# Patient Record
Sex: Male | Born: 1995 | Race: White | Hispanic: No | Marital: Single | Smoking: Never smoker
Health system: Southern US, Community
[De-identification: ages and names within clinical notes are randomized; demographics above are authoritative.]

## PROBLEM LIST (undated history)

## (undated) DIAGNOSIS — K859 Acute pancreatitis without necrosis or infection, unspecified: Secondary | ICD-10-CM

## (undated) HISTORY — DX: Acute pancreatitis without necrosis or infection, unspecified: K85.90

---
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
## 2015-01-21 ENCOUNTER — Encounter: Payer: Self-pay | Admitting: Allergy and Immunology

## 2015-01-21 ENCOUNTER — Ambulatory Visit (INDEPENDENT_AMBULATORY_CARE_PROVIDER_SITE_OTHER): Payer: 59 | Admitting: Allergy and Immunology

## 2015-01-21 VITALS — BP 124/80 | HR 76 | Temp 97.7°F | Resp 12 | Ht 72.64 in | Wt 239.4 lb

## 2015-01-21 DIAGNOSIS — J3089 Other allergic rhinitis: Secondary | ICD-10-CM | POA: Diagnosis not present

## 2015-01-21 DIAGNOSIS — J4530 Mild persistent asthma, uncomplicated: Secondary | ICD-10-CM | POA: Diagnosis not present

## 2015-01-21 MED ORDER — BECLOMETHASONE DIPROPIONATE 40 MCG/ACT IN AERS: 2.0000 | INHALATION_SPRAY | Freq: Two times a day (BID) | RESPIRATORY_TRACT | Status: DC

## 2015-01-21 MED ORDER — MONTELUKAST SODIUM 10 MG PO TABS: 10.0000 mg | ORAL_TABLET | Freq: Every day | ORAL | Status: DC

## 2015-01-21 MED ORDER — ALBUTEROL SULFATE HFA 108 (90 BASE) MCG/ACT IN AERS: 2.0000 | INHALATION_SPRAY | RESPIRATORY_TRACT | Status: DC | PRN

## 2015-01-21 MED ORDER — FLUTICASONE PROPIONATE 50 MCG/ACT NA SUSP: 2.0000 | NASAL | Status: DC | PRN

## 2015-01-21 MED ORDER — AEROCHAMBER PLUS MISC: Status: DC

## 2015-01-21 NOTE — Patient Instructions (Addendum)
Mild persistent asthma  A prescription has been provided for Qvar (beclomethasone) 40 g, 2 inhalations twice a day. To maximize pulmonary deposition, a spacer has been provided along with instructions for its proper administration with an HFA inhaler.  A prescription has been provided for montelukast 10 mg daily at bedtime.  Continue albuterol HFA, 1-2 inhalations via spacer device every 4-6 hours as needed.  Subjective and objective measures of pulmonary function will be followed and the treatment plan will be adjusted accordingly.     Allergic rhinitis Perennial and seasonal allergic rhinoconjunctivitis.  Aeroallergen avoidance measures have been discussed and provided in written form.  A prescription has been provided for fluticasone nasal spray, 2 sprays per nostril daily as needed. Proper nasal spray technique has been discussed and demonstrated.  A prescription has been provided for montelukast (as above).  Cetirizine 10 mg daily as needed.  The risks and benefits of aeroallergen immunotherapy have been discussed. The patient is motivated to initiate immunotherapy if insurance coverage is favorable. He will let us know how he would like to proceed.    Return in about 6 weeks (around 03/04/2015), or if symptoms worsen or fail to improve.  Reducing Pollen Exposure  The American Academy of Allergy, Asthma and Immunology suggests the following steps to reduce your exposure to pollen during allergy seasons.    1. Do not hang sheets or clothing out to dry; pollen may collect on these items. 2. Do not mow lawns or spend time around freshly cut grass; mowing stirs up pollen. 3. Keep windows closed at night.  Keep car windows closed while driving. 4. Minimize morning activities outdoors, a time when pollen counts are usually at their highest. 5. Stay indoors as much as possible when pollen counts or humidity is high and on windy days when pollen tends to remain in the air  longer. 6. Use air conditioning when possible.  Many air conditioners have filters that trap the pollen spores. 7. Use a HEPA room air filter to remove pollen form the indoor air you breathe.  Control of Mold Allergen  Mold and fungi can grow on a variety of surfaces provided certain temperature and moisture conditions exist.  Outdoor molds grow on plants, decaying vegetation and soil.  The major outdoor mold, Alternaria dn Cladosporium, are found in very high numbers during hot and dry conditions.  Generally, a late Summer - Fall peak is seen for common outdoor fungal spores.  Rain will temporarily lower outdoor mold spore count, but counts rise rapidly when the rainy period ends.  The most important indoor molds are Aspergillus and Penicillium.  Dark, humid and poorly ventilated basements are ideal sites for mold growth.  The next most common sites of mold growth are the bathroom and the kitchen.  Outdoor [employer] 2. Use air conditioning and keep windows closed 3. Avoid exposure to decaying vegetation. 4. Avoid leaf raking. 5. Avoid grain handling. 6. Consider wearing a face mask if working in moldy areas.  Indoor Mold Control 1. Maintain humidity below 50%. 2. Clean washable surfaces with 5% bleach solution. 3. Remove sources e.g. Contaminated carpets.  Control of Dog or Cat Allergen  Avoidance is the best way to manage a dog or cat allergy. If you have a dog or cat and are allergic to dog or cats, consider removing the dog or cat from the home. If you have a dog or cat but don't want to find it a new home, or if your family wants  a pet even though someone in the household is allergic, here are some strategies that may help keep symptoms at bay:  1. Keep the pet out of your bedroom and restrict it to only a few rooms. Be advised that keeping the dog or cat in only one room will not limit the allergens to that room. 2. Don't pet, hug or kiss the dog or cat; if you do, wash your hands  with soap and water. 3. High-efficiency particulate air (HEPA) cleaners run continuously in a bedroom or living room can reduce allergen levels over time. 4. Regular use of a high-efficiency vacuum cleaner or a central vacuum can reduce allergen levels. 5. Giving your dog or cat a bath at least once a week can reduce airborne allergen.  Control of House Dust Mite Allergen  House dust mites play a major role in allergic asthma and rhinitis.  They occur in environments with high humidity wherever human skin, the food for dust mites is found. High levels have been detected in dust obtained from mattresses, pillows, carpets, upholstered furniture, bed covers, clothes and soft toys.  The principal allergen of the house dust mite is found in its feces.  A gram of dust may contain 1,000 mites and 250,000 fecal particles.  Mite antigen is easily measured in the air during house cleaning activities.    1. Encase mattresses, including the box spring, and pillow, in an air tight cover.  Seal the zipper end of the encased mattresses with wide adhesive tape. 2. Wash the bedding in water of 130 degrees Farenheit weekly.  Avoid cotton comforters/quilts and flannel bedding: the most ideal bed covering is the dacron comforter. 3. Remove all upholstered furniture from the bedroom. 4. Remove carpets, carpet padding, rugs, and non-washable window drapes from the bedroom.  Wash drapes weekly or use plastic window coverings. 5. Remove all non-washable stuffed toys from the bedroom.  Wash stuffed toys weekly. 6. Have the room cleaned frequently with a vacuum cleaner and a damp dust-mop.  The patient should not be in a room which is being cleaned and should wait 1 hour after cleaning before going into the room. 7. Close and seal all heating outlets in the bedroom.  Otherwise, the room will become filled with dust-laden air.  An electric heater can be used to heat the room. 8. Reduce indoor humidity to less than 50%.  Do  not use a humidifier.  Control of Cockroach Allergen  Cockroach allergen has been identified as an important cause of acute attacks of asthma, especially in urban settings.  There are fifty-five species of cockroach that exist in the [place], however only three, the [place], [place] and Oriental species produce allergen that can affect patients with Asthma.  Allergens can be obtained from fecal particles, egg casings and secretions from cockroaches.    1. Remove food sources. 2. Reduce access to water. 3. Seal access and entry points. 4. Spray runways with 0.5-1% Diazinon or Chlorpyrifos 5. Blow boric acid power under stoves and refrigerator. 6. Place bait stations (hydramethylnon) at feeding sites.

## 2015-01-21 NOTE — Assessment & Plan Note (Signed)
   A prescription has been provided for Qvar (beclomethasone) 40 g, 2 inhalations twice a day. To maximize pulmonary deposition, a spacer has been provided along with instructions for its proper administration with an HFA inhaler.  A prescription has been provided for montelukast 10 mg daily at bedtime.  Continue albuterol HFA, 1-2 inhalations via spacer device every 4-6 hours as needed.  Subjective and objective measures of pulmonary function will be followed and the treatment plan will be adjusted accordingly.

## 2015-01-21 NOTE — Assessment & Plan Note (Addendum)
Perennial and seasonal allergic rhinoconjunctivitis.  Aeroallergen avoidance measures have been discussed and provided in written form.  A prescription has been provided for fluticasone nasal spray, 2 sprays per nostril daily as needed. Proper nasal spray technique has been discussed and demonstrated.  A prescription has been provided for montelukast (as above).  Cetirizine 10 mg daily as needed.  The risks and benefits of aeroallergen immunotherapy have been discussed. The patient is motivated to initiate immunotherapy if insurance coverage is favorable. He will let us know how he would like to proceed.

## 2015-01-21 NOTE — Progress Notes (Signed)
History of present illness: HPI Comments: Gabriel Gonzalez is a 19 y.o. male who presents today for his initial consultation of possible asthma.  He is accompanied by his mother who assists with a history.  Approximately one year ago, Gabriel Gonzalez began to experience nocturnal awakenings due to coughing.  He was prescribed albuterol HFA which provided temporary relief to his symptoms.  The frequency and severity of his symptoms have progressed over the past year, including coughing, chest tightness, dyspnea, and wheezing.  Specific triggers include laughing and upper respiratory tract infections, however he does experience these symptoms "at random" throughout the day.  He requires albuterol rescue 2 or 3 times per week on average. Gabriel Gonzalez experiences nasal congestion, rhinorrhea, sneezing, postnasal drainage, ocular pruritus, and occasional sinus pressure.  These symptoms occur year round but seem to be more severe in the springtime and in the fall.   Assessment and plan: Mild persistent asthma  A prescription has been provided for Qvar (beclomethasone) 40 g, 2 inhalations twice a day. To maximize pulmonary deposition, a spacer has been provided along with instructions for its proper administration with an HFA inhaler.  A prescription has been provided for montelukast 10 mg daily at bedtime.  Continue albuterol HFA, 1-2 inhalations via spacer device every 4-6 hours as needed.  Subjective and objective measures of pulmonary function will be followed and the treatment plan will be adjusted accordingly.     Allergic rhinitis Perennial and seasonal allergic rhinoconjunctivitis.  Aeroallergen avoidance measures have been discussed and provided in written form.  A prescription has been provided for fluticasone nasal spray, 2 sprays per nostril daily as needed. Proper nasal spray technique has been discussed and demonstrated.  A prescription has been provided for montelukast (as  above).  Cetirizine 10 mg daily as needed.  The risks and benefits of aeroallergen immunotherapy have been discussed. The patient is motivated to initiate immunotherapy if insurance coverage is favorable. He will let us know how he would like to proceed.    Medications ordered this encounter: No orders of the defined types were placed in this encounter.    Diagnositics: Spirometry: FVC was 5.89 L (107% predicted) and FEV1 was 4.01 L (84% predicted) with significant (450 mL) post bronchodilator improvement. Allergy skin testing: Positive to grass pollen, weed pollen, ragweed pollen, mold, dust mite, cat hair, dog epithelia, and cockroach antigen.    Physical examination: Blood pressure 124/80, pulse 76, temperature 97.7 F (36.5 C), temperature source Oral, resp. rate 12, height 6' 0.64" (1.845 m), weight 239 lb 6.7 oz (108.6 kg).  General: Alert, interactive, in no acute distress. HEENT: TMs pearly gray, turbinates moderately edematous and pale without discharge, post-pharynx erythematous. Neck: Supple without lymphadenopathy. Lungs: Clear to auscultation without wheezing, rhonchi or rales. CV: Normal S1, S2 without murmurs. Abdomen: Nondistended, nontender. Skin: Warm and dry, without lesions or rashes. Extremities:  No clubbing, cyanosis or edema. Neuro:   Grossly intact.  Review of systems: Review of Systems  Constitutional: Negative for fever, chills and weight loss.  HENT: Negative for nosebleeds.   Eyes: Negative for blurred vision.  Respiratory: Negative for hemoptysis.   Cardiovascular: Negative for chest pain.  Gastrointestinal: Negative for diarrhea and constipation.  Genitourinary: Negative for dysuria.  Musculoskeletal: Negative for myalgias and joint pain.  Neurological: Negative for dizziness.  Endo/Heme/Allergies: Does not bruise/bleed easily.    Past medical history: History reviewed. No pertinent past medical history.  Past surgical history: History  reviewed. No pertinent past surgical history.  Family  history: Family History  Problem Relation Age of Onset  . Cancer Maternal Grandmother     liver  . Psoriasis Mother   . Eczema Maternal Aunt   . Asthma Paternal Uncle   . Urticaria Neg Hx   . Immunodeficiency Neg Hx   . Atopy Neg Hx   . Angioedema Neg Hx   . Allergic rhinitis Neg Hx     Social history: Social History   Social History  . Marital Status: Single    Spouse Name: N/A  . Number of Children: N/A  . Years of Education: N/A   Occupational History  . Not on file.   Social History Main Topics  . Smoking status: Never Smoker   . Smokeless tobacco: Never Used  . Alcohol Use: Not on file  . Drug Use: Not on file  . Sexual Activity: Not on file   Other Topics Concern  . Not on file   Social History Narrative   Environmental History: The patient lives in a 20 year old house with carpeting in the bedroom.  There are 2 rabbits in the house and one dog outdoors.  No pets have access to his bedroom.  He is a nonsmoker and is not exposed to significant secondhand cigarette smoke.  Known medication allergies: No Known Allergies  Outpatient medications:   Medication List       This list is accurate as of: 01/21/15 11:02 AM.  Always use your most recent med list.               albuterol 108 (90 BASE) MCG/ACT inhaler  Commonly known as:  PROVENTIL HFA;VENTOLIN HFA  Inhale 2 puffs into the lungs every 6 (six) hours as needed for wheezing.        I appreciate the opportunity to take part in this Jordan's care. Please do not hesitate to contact me with questions.  Sincerely,   R. Jorene Guestarter Karandeep Resende, MD

## 2015-01-23 ENCOUNTER — Other Ambulatory Visit: Payer: Self-pay | Admitting: *Deleted

## 2015-01-23 DIAGNOSIS — J4530 Mild persistent asthma, uncomplicated: Secondary | ICD-10-CM

## 2015-03-17 ENCOUNTER — Encounter: Payer: Self-pay | Admitting: Allergy and Immunology

## 2015-03-17 ENCOUNTER — Ambulatory Visit (INDEPENDENT_AMBULATORY_CARE_PROVIDER_SITE_OTHER): Payer: 59 | Admitting: Allergy and Immunology

## 2015-03-17 VITALS — BP 112/78 | HR 80 | Resp 18

## 2015-03-17 DIAGNOSIS — J3089 Other allergic rhinitis: Secondary | ICD-10-CM | POA: Diagnosis not present

## 2015-03-17 DIAGNOSIS — J4530 Mild persistent asthma, uncomplicated: Secondary | ICD-10-CM

## 2015-03-17 NOTE — Progress Notes (Signed)
RE: Gabriel Gonzalez MRN: [id number] DOB: [birth year] [facility] [place] [address] [place]  [place] [id number] Date of Office Visit: 03/17/2015  Referring provider: Merlyn AlbertWilliam S Luking, MD [address] [place], [place] [id number]  History of present illness: HPI Comments: Gabriel Brownsnthony "[place]" Loney Gonzalez is a 19 y.o. male with persistent asthma and allergic rhinitis who presents today for follow up.  He reports that his asthma and allergic rhinitis symptoms have improved significantly in the interval since his initial visit 2 months ago.  He has not required asthma rescue medication, experienced nocturnal awakenings due to lower respiratory symptoms, nor have activities of daily living been limited.  He has no nasal symptom complaints today.   Assessment and plan: Mild persistent asthma Well-controlled.  We will stepdown therapy at this time.  Decrease Qvar 40 g to one inhalation via spacer device twice a day.  If lower respiratory symptoms progress in frequency and/or severity, the patient is to resume the previous dose.  Continue montelukast 10 mg daily at bedtime and albuterol every 4-6 hours as needed.  Subjective and objective measures of pulmonary function will be followed and the treatment plan will be adjusted accordingly.  Allergic rhinitis  Continue appropriate allergen avoidance measures, montelukast daily, and fluticasone nasal spray as needed.   Diagnositics: Spirometry:  Normal.  Please see scanned spirometry results for details.    Physical examination: Blood pressure 112/78, pulse 80, resp. rate 18.  General: Alert, interactive, in no acute distress. HEENT: TMs pearly gray, turbinates minimally edematous without discharge, post-pharynx mildly erythematous. Neck: Supple without lymphadenopathy. Lungs: Clear to auscultation without wheezing, rhonchi or rales. CV: Normal S1, S2 without murmurs. Skin: Warm and  dry, without lesions or rashes.  The following portions of the patient's history were reviewed and updated as appropriate: allergies, current medications, past family history, past medical history, past social history, past surgical history and problem list.  Outpatient medications:   Medication List       This list is accurate as of: 03/17/15  7:18 PM.  Always use your most recent med list.               AEROCHAMBER PLUS inhaler  Use as instructed     albuterol 108 (90 BASE) MCG/ACT inhaler  Commonly known as:  VENTOLIN HFA  Inhale 2 puffs into the lungs every 4 (four) hours as needed for wheezing or shortness of breath.     beclomethasone 40 MCG/ACT inhaler  Commonly known as:  QVAR  Inhale 2 puffs into the lungs 2 (two) times daily.     fluticasone 50 MCG/ACT nasal spray  Commonly known as:  FLONASE  Place 2 sprays into both nostrils as needed for allergies or rhinitis.     montelukast 10 MG tablet  Commonly known as:  SINGULAIR  Take 1 tablet (10 mg total) by mouth at bedtime.        Known medication allergies: No Known Allergies  I appreciate the opportunity to take part in this Gabriel Gonzalez care. Please do not hesitate to contact me with questions.  Sincerely,   R. Jorene Guestarter Arvie Villarruel, MD

## 2015-03-17 NOTE — Assessment & Plan Note (Signed)
   Continue appropriate allergen avoidance measures, montelukast daily, and fluticasone nasal spray as needed.

## 2015-03-17 NOTE — Assessment & Plan Note (Signed)
Well-controlled.  We will stepdown therapy at this time.  Decrease Qvar 40 g to one inhalation via spacer device twice a day.  If lower respiratory symptoms progress in frequency and/or severity, the patient is to resume the previous dose.  Continue montelukast 10 mg daily at bedtime and albuterol every 4-6 hours as needed.  Subjective and objective measures of pulmonary function will be followed and the treatment plan will be adjusted accordingly.

## 2015-03-17 NOTE — Patient Instructions (Signed)
Mild persistent asthma Well-controlled.  We will stepdown therapy at this time.  Decrease Qvar 40 g to one inhalation via spacer device twice a day.  If lower respiratory symptoms progress in frequency and/or severity, the patient is to resume the previous dose.  Continue montelukast 10 mg daily at bedtime and albuterol every 4-6 hours as needed.  Subjective and objective measures of pulmonary function will be followed and the treatment plan will be adjusted accordingly.  Allergic rhinitis  Continue appropriate allergen avoidance measures, montelukast daily, and fluticasone nasal spray as needed.   Return in about 4 months (around 07/16/2015), or if symptoms worsen or fail to improve.

## 2015-09-15 ENCOUNTER — Encounter: Payer: Self-pay | Admitting: Allergy and Immunology

## 2015-09-15 ENCOUNTER — Ambulatory Visit (INDEPENDENT_AMBULATORY_CARE_PROVIDER_SITE_OTHER): Payer: 59 | Admitting: Allergy and Immunology

## 2015-09-15 VITALS — BP 120/80 | HR 80 | Resp 16

## 2015-09-15 DIAGNOSIS — J4530 Mild persistent asthma, uncomplicated: Secondary | ICD-10-CM | POA: Diagnosis not present

## 2015-09-15 DIAGNOSIS — J4531 Mild persistent asthma with (acute) exacerbation: Secondary | ICD-10-CM | POA: Diagnosis not present

## 2015-09-15 DIAGNOSIS — J3089 Other allergic rhinitis: Secondary | ICD-10-CM | POA: Diagnosis not present

## 2015-09-15 MED ORDER — MONTELUKAST SODIUM 10 MG PO TABS: 10.0000 mg | ORAL_TABLET | Freq: Every day | ORAL | Status: DC

## 2015-09-15 MED ORDER — BECLOMETHASONE DIPROPIONATE 40 MCG/ACT IN AERS: 2.0000 | INHALATION_SPRAY | Freq: Two times a day (BID) | RESPIRATORY_TRACT | Status: DC

## 2015-09-15 MED ORDER — FLUTICASONE PROPIONATE 50 MCG/ACT NA SUSP: 2.0000 | NASAL | Status: DC | PRN

## 2015-09-15 NOTE — Assessment & Plan Note (Signed)
Stable.  Continue appropriate allergen avoidance measures, montelukast daily, and fluticasone nasal spray as needed. 

## 2015-09-15 NOTE — Assessment & Plan Note (Addendum)
   For now, and with all upper respiratory tract infections and asthma flares, increase Qvar 40 g to 3 inhalations via spacer device twice a day.  When symptoms have returned baseline, resume one inhalation via spacer device twice a day.  Continue montelukast 10 mg daily at bedtime and albuterol every 4-6 hours as needed.  Proper dosing schedules has been discussed.  Gabriel Gonzalez verbalized understanding.  The patient has been asked to contact me if his symptoms persist or progress. Otherwise, he may return for follow up in 4 months.

## 2015-09-15 NOTE — Patient Instructions (Addendum)
Mild persistent asthma  For now, and with all upper respiratory tract infections and asthma flares, increase Qvar 40 g to 3 inhalations via spacer device twice a day.  When symptoms have returned baseline, resume one inhalation via spacer device twice a day.  Continue montelukast 10 mg daily at bedtime and albuterol every 4-6 hours as needed.  Proper dosing schedules has been discussed.  Gabriel Gonzalez verbalized understanding.  The patient has been asked to contact me if his symptoms persist or progress. Otherwise, he may return for follow up in 4 months.  Allergic rhinitis Stable.  Continue appropriate allergen avoidance measures, montelukast daily, and fluticasone nasal spray as needed.    Return in about 4 months (around 01/15/2016), or if symptoms worsen or fail to improve.

## 2015-09-15 NOTE — Progress Notes (Signed)
Follow-up Note  RE: JOURNEY RATTERMAN MRN: [id number] DOB: 1996-03-20 Date of Office Visit: 09/15/2015  Primary care provider: Lubertha South, MD Referring provider: Merlyn Albert, MD  History of present illness: HPI Comments: Gabriel Gonzalez is a 20 y.o. male with persistent asthma and allergic rhinitis who presents today for sick visit.  He was last seen in this clinic in December 2016.  He reports that his asthma has been relatively well-controlled, however over the past 2 or 3 weeks he has experienced dyspnea and chest tightness with laughing and mild/moderate exertion.  The symptoms are relieved by albuterol rescue.  He is currently taking Qvar 40 g, one inhalation daily and montelukast sporadically.  He has no nasal symptom complaints today.   Assessment and plan: Mild persistent asthma  For now, and with all upper respiratory tract infections and asthma flares, increase Qvar 40 g to 3 inhalations via spacer device twice a day.  When symptoms have returned baseline, resume one inhalation via spacer device twice a day.  Continue montelukast 10 mg daily at bedtime and albuterol every 4-6 hours as needed.  Proper dosing schedules has been discussed.  [name] verbalized understanding.  The patient has been asked to contact me if his symptoms persist or progress. Otherwise, he may return for follow up in 4 months.  Allergic rhinitis Stable.  Continue appropriate allergen avoidance measures, montelukast daily, and fluticasone nasal spray as needed.   Diagnositics: Spirometry reveals an FVC of 5.53 L and an FEV1 of 4.31 L with 220 mL post bronchodilator improvement.  Please see scanned spirometry results for details.    Physical examination: Blood pressure 120/80, pulse 80, resp. rate 16, SpO2 98 %.  General: Alert, interactive, in no acute distress. HEENT: TMs pearly gray, turbinates mildly edematous without discharge, post-pharynx mildly  erythematous. Neck: Supple without lymphadenopathy. Lungs: Clear to auscultation without wheezing, rhonchi or rales. CV: Normal S1, S2 without murmurs. Skin: Warm and dry, without lesions or rashes.  The following portions of the patient's history were reviewed and updated as appropriate: allergies, current medications, past family history, past medical history, past social history, past surgical history and problem list.    Medication List       This list is accurate as of: 09/15/15  5:26 PM.  Always use your most recent med list.               AEROCHAMBER PLUS inhaler  Use as instructed     albuterol 108 (90 Base) MCG/ACT inhaler  Commonly known as:  VENTOLIN HFA  Inhale 2 puffs into the lungs every 4 (four) hours as needed for wheezing or shortness of breath.     beclomethasone 40 MCG/ACT inhaler  Commonly known as:  QVAR  Inhale 2 puffs into the lungs 2 (two) times daily.     fluticasone 50 MCG/ACT nasal spray  Commonly known as:  FLONASE  Place 2 sprays into both nostrils as needed for allergies or rhinitis.     montelukast 10 MG tablet  Commonly known as:  SINGULAIR  Take 1 tablet (10 mg total) by mouth at bedtime.        No Known Allergies  Review of systems: Constitutional: Negative for fever, chills and weight loss.  HENT: Negative for nosebleeds.   Eyes: Negative for blurred vision.  Respiratory: Negative for hemoptysis.   Positive for dyspnea and chest tightness. Cardiovascular: Negative for chest pain.  Gastrointestinal: Negative for diarrhea and constipation.  Genitourinary: Negative  for dysuria.  Musculoskeletal: Negative for myalgias and joint pain.  Neurological: Negative for dizziness.  Endo/Heme/Allergies: Does not bruise/bleed easily.  Cutaneous: Negative for rash.  No past medical history on file.  Family History  Problem Relation Age of Onset  . Cancer Maternal Grandmother     liver  . Psoriasis Mother   . Eczema Maternal Aunt   .  Asthma Paternal Uncle   . Urticaria Neg Hx   . Immunodeficiency Neg Hx   . Atopy Neg Hx   . Angioedema Neg Hx   . Allergic rhinitis Neg Hx     Social History   Social History  . Marital Status: Single    Spouse Name: N/A  . Number of Children: N/A  . Years of Education: N/A   Occupational History  . Not on file.   Social History Main Topics  . Smoking status: Never Smoker   . Smokeless tobacco: Never Used  . Alcohol Use: Not on file  . Drug Use: Not on file  . Sexual Activity: Not on file   Other Topics Concern  . Not on file   Social History Narrative    I appreciate the opportunity to take part in Jordan's care. Please do not hesitate to contact me with questions.  Sincerely,   R. Jorene Guestarter Artemis Loyal, MD

## 2015-10-12 ENCOUNTER — Telehealth: Payer: Self-pay | Admitting: Allergy and Immunology

## 2015-10-12 NOTE — Telephone Encounter (Signed)
His bill isn't showing that his copayment was made or that his insurance covered anything. Can you please look at this and give his mom a call back? Thanks

## 2015-12-04 ENCOUNTER — Encounter: Payer: Self-pay | Admitting: Family Medicine

## 2015-12-04 ENCOUNTER — Other Ambulatory Visit: Payer: Self-pay | Admitting: Family Medicine

## 2015-12-04 ENCOUNTER — Other Ambulatory Visit (HOSPITAL_COMMUNITY)
Admission: RE | Admit: 2015-12-04 | Discharge: 2015-12-04 | Disposition: A | Discharge status: Home / Self Care | Payer: 59 | Source: Ambulatory Visit | Attending: Family Medicine | Admitting: Family Medicine

## 2015-12-04 ENCOUNTER — Ambulatory Visit (INDEPENDENT_AMBULATORY_CARE_PROVIDER_SITE_OTHER): Payer: 59 | Admitting: Family Medicine

## 2015-12-04 VITALS — Temp 100.0°F | Wt 244.0 lb

## 2015-12-04 DIAGNOSIS — M791 Myalgia, unspecified site: Secondary | ICD-10-CM

## 2015-12-04 DIAGNOSIS — R509 Fever, unspecified: Secondary | ICD-10-CM | POA: Diagnosis present

## 2015-12-04 LAB — HEPATIC FUNCTION PANEL
ALK PHOS: 43 U/L (ref 38–126)
ALT: 45 U/L (ref 17–63)
AST: 38 U/L (ref 15–41)
Albumin: 4.6 g/dL (ref 3.5–5.0)
BILIRUBIN DIRECT: 0.2 mg/dL (ref 0.1–0.5)
BILIRUBIN INDIRECT: 0.7 mg/dL (ref 0.3–0.9)
TOTAL PROTEIN: 7.6 g/dL (ref 6.5–8.1)
Total Bilirubin: 0.9 mg/dL (ref 0.3–1.2)

## 2015-12-04 LAB — POCT URINALYSIS DIPSTICK
PH UA: 6
SPEC GRAV UA: 1.02

## 2015-12-04 LAB — CBC WITH DIFFERENTIAL/PLATELET
BASOS ABS: 0 10*3/uL (ref 0.0–0.1)
Basophils Relative: 0 %
Eosinophils Absolute: 0 10*3/uL (ref 0.0–0.7)
Eosinophils Relative: 0 %
HCT: 40.9 % (ref 39.0–52.0)
HEMOGLOBIN: 14.2 g/dL (ref 13.0–17.0)
LYMPHS ABS: 0.5 10*3/uL — AB (ref 0.7–4.0)
LYMPHS PCT: 10 %
MCH: 30.5 pg (ref 26.0–34.0)
MCHC: 34.7 g/dL (ref 30.0–36.0)
MCV: 87.8 fL (ref 78.0–100.0)
Monocytes Absolute: 0.5 10*3/uL (ref 0.1–1.0)
Monocytes Relative: 10 %
NEUTROS ABS: 4 10*3/uL (ref 1.7–7.7)
NEUTROS PCT: 80 %
PLATELETS: 142 10*3/uL — AB (ref 150–400)
RBC: 4.66 MIL/uL (ref 4.22–5.81)
RDW: 12.8 % (ref 11.5–15.5)
WBC: 5 10*3/uL (ref 4.0–10.5)

## 2015-12-04 LAB — BASIC METABOLIC PANEL
ANION GAP: 13 (ref 5–15)
BUN: 13 mg/dL (ref 6–20)
CO2: 23 mmol/L (ref 22–32)
Calcium: 9.2 mg/dL (ref 8.9–10.3)
Chloride: 101 mmol/L (ref 101–111)
Creatinine, Ser: 1.01 mg/dL (ref 0.61–1.24)
GFR calc Af Amer: >60 mL/min (ref >60)
GLUCOSE: 137 mg/dL — AB (ref 65–99)
POTASSIUM: 3.4 mmol/L — AB (ref 3.5–5.1)
Sodium: 137 mmol/L (ref 135–145)

## 2015-12-04 MED ORDER — DOXYCYCLINE HYCLATE 100 MG PO TABS: 100.0000 mg | ORAL_TABLET | Freq: Two times a day (BID) | ORAL | 0 refills | Status: DC

## 2015-12-04 NOTE — Progress Notes (Signed)
   Subjective:    Patient ID: Gabriel Gonzalez, male    DOB: 03-Feb-1996, 20 y.o.   MRN: [id number]  HPI Patient arrives with c/o fever and vomiting that started last night. This patient start having fever yesterday during the night did vomit once. No diarrhea. He is had intermittent body aches muscle aches feels Ht. Denies chest congestion coughing or wheezing. Mom states at times it seems like he breathes heavy. But there is been no wheezing no cough no hemoptysis. They did travel approximately a week ago to get number [place] in went and stayed in a cabin. No known tick bites. No other family members been sick. Young man denies any severe sore throat. States he just doesn't feel well.  Review of Systems See above.    Objective:   Physical Exam Makes good eye contact. Does not appear toxic. Does look like he feels somewhat ill. Sclera normal color. Eardrums normal. Throat nonerythematous. Neck no masses. Lungs are clear no crackle. Heart regular. Abdomen is soft no guarding or rebound. No rashes noted on the abdomen chest or back none on the arms none on the legs. Neurologic grossly normal. Neck is supple.       Assessment & Plan:  [phone] Dads [phone]  Febrile illness-stat labs ordered. With his symptomatology we need to cover for the possibility of tick related illness. Doxycycline 100 mg twice a day for the next 10 days. Recheck on Monday.  Mother was counseled that if the patient starts having excruciating headache neck stiffness vomiting disorientation or excessive lethargy immediately go to ER.  I doubt meningitis I doubt bacterial infection. Urine under the microscope showed an occasional WBC we will send the urine for urine culture.

## 2015-12-06 LAB — B. BURGDORFI ANTIBODIES: B burgdorferi Ab IgG+IgM: <0.91 {ISR} (ref 0.00–0.90)

## 2015-12-07 ENCOUNTER — Ambulatory Visit (INDEPENDENT_AMBULATORY_CARE_PROVIDER_SITE_OTHER): Payer: 59 | Admitting: Family Medicine

## 2015-12-07 ENCOUNTER — Encounter: Payer: Self-pay | Admitting: Family Medicine

## 2015-12-07 VITALS — BP 110/76 | Temp 98.7°F | Wt 246.1 lb

## 2015-12-07 DIAGNOSIS — R509 Fever, unspecified: Secondary | ICD-10-CM | POA: Diagnosis not present

## 2015-12-07 MED ORDER — ONDANSETRON 4 MG PO TBDP: 4.0000 mg | ORAL_TABLET | Freq: Three times a day (TID) | ORAL | 0 refills | Status: DC | PRN

## 2015-12-07 NOTE — Progress Notes (Signed)
   Subjective:    Patient ID: Gabriel Gonzalez, male    DOB: [birth year], 20 y.o.   MRN: [id number]  Fever   This is a new problem. The current episode started in the past 7 days. Associated symptoms include nausea. He has tried acetaminophen (Doxycline, Tylenol) for the symptoms.   Patient arrives office with fever. See prior note. Day 5 of doxycycline. Expressing some nausea. Seems to worsen since initiating Doxey  All blood work reviewed. Platelet count was just a hair low   Patient states no other concerns this visit.   Review of Systems  Constitutional: Positive for fever.  Gastrointestinal: Positive for nausea.       Objective:   Physical Exam  Alert vitals stable, NAD. Blood pressure good on repeat. HEENT normal. Lungs clear. Heart regular rate and rhythm.       Assessment & Plan:  Impression febrile illness in retrospect likely viral in etiology. Patient did have enough risk factors and symptomatology to warrant further testing finish at least 7 days a Doxey. Add Zofran when necessary WSL

## 2015-12-08 ENCOUNTER — Telehealth: Payer: Self-pay | Admitting: Family Medicine

## 2015-12-08 LAB — ROCKY MTN SPOTTED FVR ABS PNL(IGG+IGM)
RMSF IgG: NEGATIVE
RMSF IgM: 0.32 index (ref 0.00–0.89)

## 2015-12-08 LAB — CULTURE, URINE COMPREHENSIVE

## 2015-12-08 NOTE — Telephone Encounter (Signed)
Review blood work results in results folder. °

## 2016-07-24 ENCOUNTER — Other Ambulatory Visit: Payer: Self-pay | Admitting: Allergy and Immunology

## 2016-07-24 DIAGNOSIS — J4531 Mild persistent asthma with (acute) exacerbation: Secondary | ICD-10-CM

## 2016-08-25 ENCOUNTER — Other Ambulatory Visit: Payer: Self-pay | Admitting: Allergy and Immunology

## 2016-08-25 DIAGNOSIS — J4531 Mild persistent asthma with (acute) exacerbation: Secondary | ICD-10-CM

## 2017-08-26 DIAGNOSIS — J060 Acute laryngopharyngitis: Secondary | ICD-10-CM | POA: Diagnosis not present

## 2017-08-28 ENCOUNTER — Encounter (INDEPENDENT_AMBULATORY_CARE_PROVIDER_SITE_OTHER): Payer: Self-pay

## 2019-04-24 ENCOUNTER — Encounter: Payer: Self-pay | Admitting: Family Medicine

## 2019-04-25 ENCOUNTER — Encounter: Payer: Self-pay | Admitting: Family Medicine

## 2019-12-25 ENCOUNTER — Other Ambulatory Visit: Payer: 59

## 2019-12-25 ENCOUNTER — Other Ambulatory Visit: Payer: Self-pay | Admitting: Critical Care Medicine

## 2019-12-25 DIAGNOSIS — Z20822 Contact with and (suspected) exposure to covid-19: Secondary | ICD-10-CM

## 2019-12-26 LAB — NOVEL CORONAVIRUS, NAA: SARS-CoV-2, NAA: NOT DETECTED

## 2019-12-26 LAB — SARS-COV-2, NAA 2 DAY TAT

## 2020-08-11 ENCOUNTER — Ambulatory Visit
Admission: RE | Admit: 2020-08-11 | Discharge: 2020-08-11 | Disposition: A | Discharge status: Home / Self Care | Payer: 59 | Source: Ambulatory Visit | Attending: Family Medicine | Admitting: Family Medicine

## 2020-08-11 ENCOUNTER — Other Ambulatory Visit: Payer: Self-pay

## 2020-08-11 VITALS — BP 123/76 | HR 80 | Temp 98.2°F | Resp 19 | Ht 73.0 in | Wt 250.0 lb

## 2020-08-11 DIAGNOSIS — R079 Chest pain, unspecified: Secondary | ICD-10-CM | POA: Diagnosis not present

## 2020-08-11 NOTE — ED Triage Notes (Addendum)
Intermittent CP that feels like a pulling sensation that goes across pt's chest that started last night.  Pt reports one other episode of this approx 1 week ago that lasted for a few minutes and then stopped.  Denies any recent cold/ flu s/s.

## 2020-08-11 NOTE — ED Provider Notes (Signed)
[facility]    CSN: [id number] Arrival date & time: 08/11/20  1442      History   Chief Complaint Chief Complaint  Patient presents with  . Chest Pain    HPI Gabriel Gonzalez is a 25 y.o. male.   Reports intermittent chest pain that feels like pulling across both sides of his chest for the last week. Denies recent illness. Denies previous symptoms. Reports that pain feels better when he presses on his chest. Denies personal cardiac history. Mom has WPW. Dad has recently had an MI. Denies SOB, fatigue, diaphoresis, radiating pain, jaw pain, arm pain, other symptoms.  ROS per HPI  The history is provided by the patient.  Chest Pain   History reviewed. No pertinent past medical history.  Patient Active Problem List   Diagnosis Date Noted  . Mild persistent asthma 01/21/2015  . Allergic rhinitis 07/30/2012    History reviewed. No pertinent surgical history.     Home Medications    Prior to Admission medications   Medication Sig Start Date End Date Taking? Authorizing Provider  albuterol (VENTOLIN HFA) 108 (90 BASE) MCG/ACT inhaler Inhale 2 puffs into the lungs every 4 (four) hours as needed for wheezing or shortness of breath. Patient not taking: Reported on 12/07/2015 01/21/15   Bobbitt, Heywood Iles, MD  doxycycline (VIBRA-TABS) 100 MG tablet Take 1 tablet (100 mg total) by mouth 2 (two) times daily. 12/04/15   Babs Sciara, MD  fluticasone (FLONASE) 50 MCG/ACT nasal spray Place 2 sprays into both nostrils as needed for allergies or rhinitis. Patient not taking: Reported on 12/07/2015 09/15/15   Bobbitt, Heywood Iles, MD  montelukast (SINGULAIR) 10 MG tablet Take 1 tablet (10 mg total) by mouth at bedtime. Patient not taking: Reported on 12/07/2015 09/15/15   Cristal Ford, MD  ondansetron (ZOFRAN ODT) 4 MG disintegrating tablet Take 1 tablet (4 mg total) by mouth every 8 (eight) hours as needed for nausea or vomiting. 12/07/15   Merlyn Albert, MD   QVAR 40 MCG/ACT inhaler INHALE 2 PUFFS INTO THE LUNGS 2 TIMES DAILY. 07/25/16   Bobbitt, Heywood Iles, MD  Spacer/Aero-Holding Chambers (AEROCHAMBER PLUS) inhaler Use as instructed 01/21/15   Bobbitt, Heywood Iles, MD    Family History Family History  Problem Relation Age of Onset  . Cancer Maternal Grandmother        liver  . Psoriasis Mother   . Eczema Maternal Aunt   . Asthma Paternal Uncle   . Urticaria Neg Hx   . Immunodeficiency Neg Hx   . Atopy Neg Hx   . Angioedema Neg Hx   . Allergic rhinitis Neg Hx     Social History Social History   Tobacco Use  . Smoking status: Never Smoker  . Smokeless tobacco: Never Used  Substance Use Topics  . Alcohol use: Never  . Drug use: Never     Allergies   Patient has no known allergies.   Review of Systems Review of Systems  Cardiovascular: Positive for chest pain.     Physical Exam Triage Vital Signs ED Triage Vitals  Enc Vitals Group     BP 08/11/20 1514 123/76     Pulse Rate 08/11/20 1514 80     Resp 08/11/20 1514 19     Temp 08/11/20 1514 98.2 F (36.8 C)     Temp Source 08/11/20 1514 Oral     SpO2 08/11/20 1514 96 %     Weight 08/11/20 1513 250  lb (113.4 kg)     Height 08/11/20 1513 6\' 1"  (1.854 m)     Head Circumference --      Peak Flow --      Pain Score 08/11/20 1512 0     Pain Loc --      Pain Edu? --      Excl. in GC? --    No data found.  Updated Vital Signs BP 123/76 (BP Location: Right Arm)   Pulse 80   Temp 98.2 F (36.8 C) (Oral)   Resp 19   Ht 6\' 1"  (1.854 m)   Wt 250 lb (113.4 kg)   SpO2 96%   BMI 32.98 kg/m   Visual Acuity Right Eye Distance:   Left Eye Distance:   Bilateral Distance:    Right Eye Near:   Left Eye Near:    Bilateral Near:     Physical Exam Vitals and nursing note reviewed.  Constitutional:      General: He is not in acute distress.    Appearance: He is well-developed and normal weight. He is not ill-appearing.  HENT:     Head: Normocephalic and  atraumatic.  Eyes:     Extraocular Movements: Extraocular movements intact.     Conjunctiva/sclera: Conjunctivae normal.     Pupils: Pupils are equal, round, and reactive to light.  Neck:     Thyroid: No thyromegaly.     Vascular: No hepatojugular reflux or JVD.     Trachea: No tracheal deviation.  Cardiovascular:     Rate and Rhythm: Normal rate and regular rhythm.     Pulses:          Carotid pulses are 2+ on the right side and 2+ on the left side.      Radial pulses are 2+ on the right side and 2+ on the left side.       Dorsalis pedis pulses are 2+ on the right side and 2+ on the left side.     Heart sounds: Normal heart sounds. No murmur heard.   Pulmonary:     Effort: Pulmonary effort is normal. No respiratory distress.     Breath sounds: Normal breath sounds.  Chest:     Chest wall: No mass, deformity, tenderness, crepitus or edema. There is no dullness to percussion.  Abdominal:     Palpations: Abdomen is soft.     Tenderness: There is no abdominal tenderness.  Musculoskeletal:     Cervical back: Normal range of motion and neck supple.  Lymphadenopathy:     Cervical: No cervical adenopathy.  Skin:    General: Skin is warm and dry.     Capillary Refill: Capillary refill takes less than 2 seconds.  Neurological:     General: No focal deficit present.     Mental Status: He is alert and oriented to person, place, and time.  Psychiatric:        Mood and Affect: Mood normal.        Behavior: Behavior normal.      UC Treatments / Results  Labs (all labs ordered are listed, but only abnormal results are displayed) Labs Reviewed - No data to display  EKG   Radiology No results found.  Procedures Procedures (including critical care time)  Medications Ordered in UC Medications - No data to display  Initial Impression / Assessment and Plan / UC Course  I have reviewed the triage vital signs and the nursing notes.  Pertinent labs & imaging results  that were  available during my care of the patient were reviewed by me and considered in my medical decision making (see chart for details).    Chest Pain  Likely muscular in origin EKG shows NSR today Low suspicion of ACS given normal EKG, absence of current pain, negative personal cardiac hx Follow up with this office or with primary care if symptoms are persisting.  Follow up in the ER for high fever, trouble swallowing, trouble breathing, other concerning symptoms.   Final Clinical Impressions(s) / UC Diagnoses   Final diagnoses:  Chest pain, unspecified type     Discharge Instructions     EKG looks totally normal today  Follow up with this office or with primary care if symptoms are persisting.  Follow up in the ER for high fever, trouble swallowing, trouble breathing, other concerning symptoms.     ED Prescriptions    None     PDMP not reviewed this encounter.   Moshe Cipro, NP 08/15/20 [phone]

## 2020-08-11 NOTE — Discharge Instructions (Signed)
EKG looks totally normal today  Follow up with this office or with primary care if symptoms are persisting.  Follow up in the ER for high fever, trouble swallowing, trouble breathing, other concerning symptoms.

## 2020-08-19 ENCOUNTER — Ambulatory Visit (INDEPENDENT_AMBULATORY_CARE_PROVIDER_SITE_OTHER): Payer: 59 | Admitting: Family Medicine

## 2020-08-19 ENCOUNTER — Other Ambulatory Visit: Payer: Self-pay

## 2020-08-19 ENCOUNTER — Encounter: Payer: Self-pay | Admitting: Family Medicine

## 2020-08-19 VITALS — BP 128/85 | HR 78 | Temp 98.2°F | Ht 73.0 in | Wt 281.0 lb

## 2020-08-19 DIAGNOSIS — R079 Chest pain, unspecified: Secondary | ICD-10-CM | POA: Diagnosis not present

## 2020-08-19 NOTE — Progress Notes (Signed)
Patient ID: Gabriel Gonzalez, male    DOB: Oct 29, 1995, 25 y.o.   MRN: [id number]   Chief Complaint  Patient presents with   Chest Pain   Subjective:    HPI follow up on chest pain. Pt states he had 2 epidsodes of chest pain that lasted 2 -3 hours both times. None since going to urgent care on 5/17.  Hasn't had it again since the urgent care visit. 2 days of pain and last 2-3 hrs both times. Rest and watching computer when started.  Pain was across chest, sharp.  Didn't take meds for it.  Didn't feel like heart burn. Not worse with breathing. No new exercising or lifting or trauma to chest.   Family history- mom has WPW.  Grandfather- cabg recently, 70's.  No problems with exercising having pain.  Doing some lifting at work recently but nothing very heavy.   Medical History Gabriel Gonzalez has no past medical history on file.   Outpatient Encounter Medications as of 08/19/2020  Medication Sig   [DISCONTINUED] albuterol (VENTOLIN HFA) 108 (90 BASE) MCG/ACT inhaler Inhale 2 puffs into the lungs every 4 (four) hours as needed for wheezing or shortness of breath. (Patient not taking: Reported on 12/07/2015)   [DISCONTINUED] doxycycline (VIBRA-TABS) 100 MG tablet Take 1 tablet (100 mg total) by mouth 2 (two) times daily.   [DISCONTINUED] fluticasone (FLONASE) 50 MCG/ACT nasal spray Place 2 sprays into both nostrils as needed for allergies or rhinitis. (Patient not taking: Reported on 12/07/2015)   [DISCONTINUED] montelukast (SINGULAIR) 10 MG tablet Take 1 tablet (10 mg total) by mouth at bedtime. (Patient not taking: Reported on 12/07/2015)   [DISCONTINUED] ondansetron (ZOFRAN ODT) 4 MG disintegrating tablet Take 1 tablet (4 mg total) by mouth every 8 (eight) hours as needed for nausea or vomiting.   [DISCONTINUED] QVAR 40 MCG/ACT inhaler INHALE 2 PUFFS INTO THE LUNGS 2 TIMES DAILY.   [DISCONTINUED] Spacer/Aero-Holding Chambers (AEROCHAMBER PLUS) inhaler Use as instructed   No  facility-administered encounter medications on file as of 08/19/2020.     Review of Systems  Constitutional:  Negative for chills and fever.  HENT:  Negative for congestion, rhinorrhea and sore throat.   Respiratory:  Negative for cough, shortness of breath and wheezing.   Cardiovascular:  Positive for chest pain. Negative for leg swelling.  Gastrointestinal:  Negative for abdominal pain, diarrhea, nausea and vomiting.  Genitourinary:  Negative for dysuria and frequency.  Skin:  Negative for rash.  Neurological:  Negative for dizziness, weakness and headaches.    Vitals BP 128/85   Pulse 78   Temp 98.2 F (36.8 C)   Ht 6\' 1"  (1.854 m)   Wt 281 lb (127.5 kg)   SpO2 100%   BMI 37.07 kg/m   Objective:   Physical Exam Vitals and nursing note reviewed.  Constitutional:      General: He is not in acute distress.    Appearance: Normal appearance. He is not ill-appearing.  HENT:     Head: Normocephalic and atraumatic.     Nose: Nose normal. No congestion.     Mouth/Throat:     Mouth: Mucous membranes are moist.     Pharynx: No oropharyngeal exudate.  Eyes:     Extraocular Movements: Extraocular movements intact.     Conjunctiva/sclera: Conjunctivae normal.     Pupils: Pupils are equal, round, and reactive to light.  Cardiovascular:     Rate and Rhythm: Normal rate and regular rhythm.     Pulses:  Normal pulses.     Heart sounds: Normal heart sounds. No murmur heard. Pulmonary:     Effort: Pulmonary effort is normal. No respiratory distress.     Breath sounds: Normal breath sounds. No wheezing, rhonchi or rales.  Chest:     Chest wall: No mass, deformity, tenderness or crepitus.  Musculoskeletal:        General: Normal range of motion.     Right lower leg: No edema.     Left lower leg: No edema.  Skin:    General: Skin is warm and dry.     Findings: No rash.  Neurological:     General: No focal deficit present.     Mental Status: He is alert and oriented to person,  place, and time.  Psychiatric:        Mood and Affect: Mood normal.        Behavior: Behavior normal.        Thought Content: Thought content normal.        Judgment: Judgment normal.    Assessment and Plan   1. Chest pain, unspecified type   Chest pain has resolved since going to ER.  Cont to monitor and call or rto if worsening.  Gave chest pain precautions and to go to ER if sweating, pain radiating to arm/jaw, nausea, and chest pain. Pt in agreement.    Return if symptoms worsen or fail to improve.   09/05/2020

## 2020-08-19 NOTE — Patient Instructions (Signed)

## 2020-10-02 ENCOUNTER — Encounter: Payer: Self-pay | Admitting: Nurse Practitioner

## 2020-10-02 ENCOUNTER — Ambulatory Visit (INDEPENDENT_AMBULATORY_CARE_PROVIDER_SITE_OTHER): Payer: 59 | Admitting: Nurse Practitioner

## 2020-10-02 ENCOUNTER — Other Ambulatory Visit: Payer: Self-pay

## 2020-10-02 VITALS — BP 128/83 | HR 74 | Temp 98.2°F | Ht 73.0 in | Wt 278.0 lb

## 2020-10-02 DIAGNOSIS — Z114 Encounter for screening for human immunodeficiency virus [HIV]: Secondary | ICD-10-CM

## 2020-10-02 DIAGNOSIS — Z13220 Encounter for screening for lipoid disorders: Secondary | ICD-10-CM

## 2020-10-02 DIAGNOSIS — Z0000 Encounter for general adult medical examination without abnormal findings: Secondary | ICD-10-CM

## 2020-10-02 DIAGNOSIS — Z1159 Encounter for screening for other viral diseases: Secondary | ICD-10-CM

## 2020-10-02 NOTE — Progress Notes (Signed)
Subjective:    Patient ID: Gabriel Gonzalez, male    DOB: [birth year], 25 y.o.   MRN: [id number]  HPI The patient comes in today for a wellness visit.    A review of their health history was completed.  A review of medications was also completed.  Any needed refills; not on any meds  Eating habits: health conscious  Falls/  MVA accidents in past few months: none  Regular exercise: active job, constant walking and lifting of 50 lb loads  Specialist pt sees on regular basis: none  Preventative health issues were discussed.   Additional concerns: none  Has been with the same male partner for 7 years.  Defers STD testing.  Doing much better with his sleep.  Was seen for some chest wall pain back in May which has resolved. Regular dental care, denies any vision problems.  Review of Systems  Constitutional:  Negative for activity change, appetite change and fatigue.  Eyes:  Negative for visual disturbance.  Respiratory:  Negative for cough, chest tightness, shortness of breath and wheezing.   Cardiovascular:  Negative for chest pain.  Gastrointestinal:  Negative for abdominal distention, abdominal pain, constipation, diarrhea, nausea and vomiting.  Genitourinary:  Negative for difficulty urinating, dysuria, frequency, genital sores, penile discharge, penile pain, penile swelling, scrotal swelling, testicular pain and urgency.  Psychiatric/Behavioral:  Negative for sleep disturbance.   Depression screen [facility] 2/9 08/19/2020  Decreased Interest 0  Down, Depressed, Hopeless 0  PHQ - 2 Score 0       Objective:   Physical Exam Vitals reviewed.  Constitutional:      General: He is not in acute distress.    Appearance: He is well-developed.  Neck:     Thyroid: No thyromegaly.     Trachea: No tracheal deviation.  Cardiovascular:     Rate and Rhythm: Normal rate and regular rhythm.     Heart sounds: Normal heart sounds.  Pulmonary:     Effort: Pulmonary effort is normal.      Breath sounds: Normal breath sounds.  Abdominal:     General: There is no distension.     Palpations: Abdomen is soft.     Tenderness: There is no abdominal tenderness.  Genitourinary:    Comments: Defers GU exam, denies any problems. Musculoskeletal:     Cervical back: Normal range of motion and neck supple.  Lymphadenopathy:     Cervical: No cervical adenopathy.  Skin:    General: Skin is warm and dry.  Neurological:     Mental Status: He is alert and oriented to person, place, and time.     Deep Tendon Reflexes: Reflexes are normal and symmetric.  Psychiatric:        Mood and Affect: Mood normal.        Behavior: Behavior normal.        Thought Content: Thought content normal.        Judgment: Judgment normal.  Today's Vitals   10/02/20 1502  BP: 128/83  Pulse: 74  Temp: 98.2 F (36.8 C)  SpO2: 99%  Weight: 278 lb (126.1 kg)  Height: 6\' 1"  (1.854 m)   Body mass index is 36.68 kg/m.      Assessment & Plan:  Routine general medical examination at a health care facility - Plan: CBC with Differential/Platelet, Lipid panel, Comprehensive metabolic panel, HIV Antibody (routine testing w rflx), Hepatitis C antibody  Screening for HIV (human immunodeficiency virus) - Plan: HIV Antibody (routine testing w  rflx)  Need for hepatitis C screening test - Plan: Hepatitis C antibody  Screening for lipid disorders - Plan: Lipid panel  Routine labs ordered including HIV and hep C screening. Encouraged healthy diet.  Discussed safe sex issues. Return in about 1 year (around 10/02/2021) for physical.

## 2020-10-03 ENCOUNTER — Encounter: Payer: Self-pay | Admitting: Nurse Practitioner

## 2021-03-11 ENCOUNTER — Encounter: Payer: Self-pay | Admitting: Emergency Medicine

## 2021-03-11 ENCOUNTER — Other Ambulatory Visit: Payer: Self-pay

## 2021-03-11 ENCOUNTER — Ambulatory Visit
Admission: EM | Admit: 2021-03-11 | Discharge: 2021-03-11 | Disposition: A | Discharge status: Home / Self Care | Payer: 59 | Attending: Urgent Care | Admitting: Urgent Care

## 2021-03-11 DIAGNOSIS — B349 Viral infection, unspecified: Secondary | ICD-10-CM | POA: Diagnosis not present

## 2021-03-11 DIAGNOSIS — R052 Subacute cough: Secondary | ICD-10-CM

## 2021-03-11 DIAGNOSIS — J3089 Other allergic rhinitis: Secondary | ICD-10-CM

## 2021-03-11 DIAGNOSIS — Z20822 Contact with and (suspected) exposure to covid-19: Secondary | ICD-10-CM | POA: Diagnosis not present

## 2021-03-11 DIAGNOSIS — J4530 Mild persistent asthma, uncomplicated: Secondary | ICD-10-CM | POA: Diagnosis not present

## 2021-03-11 MED ORDER — ALBUTEROL SULFATE HFA 108 (90 BASE) MCG/ACT IN AERS: 1.0000 | INHALATION_SPRAY | Freq: Four times a day (QID) | RESPIRATORY_TRACT | 0 refills | Status: DC | PRN

## 2021-03-11 MED ORDER — PREDNISONE 20 MG PO TABS: ORAL_TABLET | ORAL | 0 refills | Status: DC

## 2021-03-11 MED ORDER — BENZONATATE 100 MG PO CAPS: 100.0000 mg | ORAL_CAPSULE | Freq: Three times a day (TID) | ORAL | 0 refills | Status: DC | PRN

## 2021-03-11 MED ORDER — PROMETHAZINE-DM 6.25-15 MG/5ML PO SYRP: 5.0000 mL | ORAL_SOLUTION | Freq: Every evening | ORAL | 0 refills | Status: DC | PRN

## 2021-03-11 NOTE — ED Triage Notes (Signed)
Cough, body aches, headache, nauseated x 2 days.

## 2021-03-11 NOTE — Discharge Instructions (Addendum)
We will notify you of your test results as they arrive and may take between 48-72 hours.  I encourage you to sign up for MyChart if you have not already done so as this can be the easiest way for [place] to communicate results to you online or through a phone app.  Generally, we only contact you if it is a positive test result.  In the meantime, if you develop worsening symptoms including fever, chest pain, shortness of breath despite our current treatment plan then please report to the emergency room as this may be a sign of worsening status from possible viral infection.  Otherwise, we will manage this as a viral syndrome. For sore throat or cough try using a honey-based tea. Use 3 teaspoons of honey with juice squeezed from half lemon. Place shaved pieces of ginger into 1/2-1 cup of water and warm over stove top. Then mix the ingredients and repeat every 4 hours as needed. Please take Tylenol 500mg -650mg  every 6 hours for aches and pains, fevers. Hydrate very well with at least 2 liters of water. Eat light meals such as soups to replenish electrolytes and soft fruits, veggies. Start an antihistamine like Zyrtec for postnasal drainage, sinus congestion.  Start the prednisone to help with your asthma. I refilled your albuterol inhaler as well. Use the cough medications as needed.

## 2021-03-11 NOTE — ED Provider Notes (Signed)
[facility]   MRN: [id number] DOB: [birth year]  Subjective:   Gabriel Gonzalez is a 25 y.o. male presenting for 2-3 day history of acute onset persistent coughing, wheezing, body aches, sinus headaches, nausea without vomiting.  Patient has a history of allergic rhinitis and asthma but does not take his medications for this consistently.  No sick contacts to the best of his knowledge.  Does not take chronic medications.  No Known Allergies  Past medical history of allergic rhinitis and asthma.  History reviewed. No pertinent surgical history.  Family History  Problem Relation Age of Onset   Cancer Maternal Grandmother        liver   Psoriasis Mother    Eczema Maternal Aunt    Asthma Paternal Uncle    Urticaria Neg Hx    Immunodeficiency Neg Hx    Atopy Neg Hx    Angioedema Neg Hx    Allergic rhinitis Neg Hx     Social History   Tobacco Use   Smoking status: Never   Smokeless tobacco: Never  Vaping Use   Vaping Use: Never used  Substance Use Topics   Alcohol use: Never   Drug use: Never    ROS   Objective:   Vitals: BP 138/82 (BP Location: Right Arm)    Pulse (!) 102    Temp 98.8 F (37.1 C) (Oral)    Resp 18    SpO2 93%   Physical Exam Constitutional:      General: He is not in acute distress.    Appearance: Normal appearance. He is well-developed and normal weight. He is not ill-appearing, toxic-appearing or diaphoretic.  HENT:     Head: Normocephalic and atraumatic.     Right Ear: Tympanic membrane, ear canal and external ear normal. There is no impacted cerumen.     Left Ear: Tympanic membrane, ear canal and external ear normal. There is no impacted cerumen.     Nose: Nose normal. No congestion or rhinorrhea.     Mouth/Throat:     Mouth: Mucous membranes are moist.     Pharynx: No oropharyngeal exudate or posterior oropharyngeal erythema.  Eyes:     General: No scleral icterus.       Right eye: No discharge.        Left eye: No  discharge.     Extraocular Movements: Extraocular movements intact.     Conjunctiva/sclera: Conjunctivae normal.     Pupils: Pupils are equal, round, and reactive to light.  Cardiovascular:     Rate and Rhythm: Normal rate and regular rhythm.     Heart sounds: Normal heart sounds. No murmur heard.   No friction rub. No gallop.  Pulmonary:     Effort: Pulmonary effort is normal. No respiratory distress.     Breath sounds: No stridor. Wheezing (mild, throughout) present. No rhonchi or rales.  Musculoskeletal:     Cervical back: Normal range of motion and neck supple. No rigidity. No muscular tenderness.  Neurological:     General: No focal deficit present.     Mental Status: He is alert and oriented to person, place, and time.  Psychiatric:        Mood and Affect: Mood normal.        Behavior: Behavior normal.        Thought Content: Thought content normal.    Assessment and Plan :   PDMP not reviewed this encounter.  1. Acute viral syndrome   2. Exposure to  COVID-19 virus   3. Mild persistent asthma without complication   4. Subacute cough    We will hold off on empiric treatment for COVID or flu.  Recommended an oral prednisone course due to his asthma, refilled his albuterol inhaler.  Use supportive care otherwise. Deferred imaging given clear cardiopulmonary exam, hemodynamically stable vital signs. Counseled patient on potential for adverse effects with medications prescribed/recommended today, ER and return-to-clinic precautions discussed, patient verbalized understanding.    Wallis Bamberg, PA-C 03/11/21 1444

## 2021-03-12 LAB — COVID-19, FLU A+B NAA
Influenza A, NAA: NOT DETECTED
Influenza B, NAA: NOT DETECTED
SARS-CoV-2, NAA: DETECTED — AB

## 2021-03-26 ENCOUNTER — Ambulatory Visit: Payer: 59 | Admitting: Family Medicine

## 2021-03-30 ENCOUNTER — Ambulatory Visit: Payer: 59 | Admitting: Family Medicine

## 2021-04-12 ENCOUNTER — Ambulatory Visit (HOSPITAL_COMMUNITY)
Admission: RE | Admit: 2021-04-12 | Discharge: 2021-04-12 | Disposition: A | Discharge status: Home / Self Care | Payer: BC Managed Care – PPO | Source: Ambulatory Visit | Attending: Family Medicine | Admitting: Family Medicine

## 2021-04-12 ENCOUNTER — Ambulatory Visit (INDEPENDENT_AMBULATORY_CARE_PROVIDER_SITE_OTHER): Payer: BC Managed Care – PPO | Admitting: Family Medicine

## 2021-04-12 ENCOUNTER — Other Ambulatory Visit: Payer: Self-pay

## 2021-04-12 VITALS — BP 126/72 | HR 82 | Temp 99.6°F | Ht 73.0 in | Wt 280.4 lb

## 2021-04-12 DIAGNOSIS — R053 Chronic cough: Secondary | ICD-10-CM | POA: Diagnosis not present

## 2021-04-12 DIAGNOSIS — R062 Wheezing: Secondary | ICD-10-CM | POA: Diagnosis not present

## 2021-04-12 DIAGNOSIS — J45901 Unspecified asthma with (acute) exacerbation: Secondary | ICD-10-CM | POA: Diagnosis not present

## 2021-04-12 MED ORDER — PREDNISONE 10 MG PO TABS: ORAL_TABLET | ORAL | 0 refills | Status: DC

## 2021-04-12 NOTE — Progress Notes (Signed)
Subjective:  Patient ID: Gabriel Gonzalez, male    DOB: 1995/06/02  Age: 26 y.o. MRN: [id number]  CC: Chief Complaint  Patient presents with   Cough   Nasal Congestion   Shortness of Breath    Difficulty breathing    HPI:  26 year old male presents for evaluation of the above.  Patient diagnosed with COVID-19 on 12/15.  Since that time has had persistent cough.  He is now worsening and causes posttussive emesis.  He reports some associated nasal congestion.  Some shortness of breath.  Has a history of asthma.  Patient states that his work wants him tested for COVID-19 as well as influenza before he can return to work.  He has been using his inhaler without resolution.  No fever.  No other associated symptoms.  No other complaints.  Patient Active Problem List   Diagnosis Date Noted   Chronic cough 04/12/2021   Asthma exacerbation 04/12/2021   Mild persistent asthma 01/21/2015   Allergic rhinitis 07/30/2012    Social Hx   Social History   Socioeconomic History   Marital status: Single    Spouse name: Not on file   Number of children: Not on file   Years of education: Not on file   Highest education level: Not on file  Occupational History   Not on file  Tobacco Use   Smoking status: Never   Smokeless tobacco: Never  Vaping Use   Vaping Use: Never used  Substance and Sexual Activity   Alcohol use: Never   Drug use: Never   Sexual activity: Yes  Other Topics Concern   Not on file  Social History Narrative   Not on file   Social Determinants of Health   Financial Resource Strain: Not on file  Food Insecurity: Not on file  Transportation Needs: Not on file  Physical Activity: Not on file  Stress: Not on file  Social Connections: Not on file    Review of Systems Per HPI  Objective:  BP 126/72    Pulse 82    Temp 99.6 F (37.6 C) (Oral)    Ht 6\' 1"  (1.854 m)    Wt 280 lb 6.4 oz (127.2 kg)    SpO2 97%    BMI 36.99 kg/m   BP/Weight 04/12/2021 03/11/2021  10/02/2020  Systolic BP 126 138 128  Diastolic BP 72 82 83  Wt. (Lbs) 280.4 - 278  BMI 36.99 - 36.68    Physical Exam Vitals and nursing note reviewed.  Constitutional:      General: He is not in acute distress.    Appearance: He is obese.  HENT:     Head: Normocephalic and atraumatic.  Eyes:     General:        Right eye: No discharge.        Left eye: No discharge.     Conjunctiva/sclera: Conjunctivae normal.  Cardiovascular:     Rate and Rhythm: Normal rate and regular rhythm.     Heart sounds: No murmur heard. Pulmonary:     Effort: Pulmonary effort is normal.     Breath sounds: Wheezing present.  Neurological:     Mental Status: He is alert.  Psychiatric:        Mood and Affect: Mood normal.        Behavior: Behavior normal.    Lab Results  Component Value Date   WBC 5.0 12/04/2015   HGB 14.2 12/04/2015   HCT 40.9 12/04/2015  PLT 142 (L) 12/04/2015   GLUCOSE 137 (H) 12/04/2015   ALT 45 12/04/2015   AST 38 12/04/2015   NA 137 12/04/2015   K 3.4 (L) 12/04/2015   CL 101 12/04/2015   CREATININE 1.01 12/04/2015   BUN 13 12/04/2015   CO2 23 12/04/2015     Assessment & Plan:   Problem List Items Addressed This Visit       Respiratory   Asthma exacerbation    Treating with albuterol and prednisone.      Relevant Medications   predniSONE (DELTASONE) 10 MG tablet     Other   Chronic cough - Primary    Chest x-ray was obtained today.  Independently reviewed by me.  Interpretation: Normal chest x-ray.  No evidence of infiltrate. Albuterol as directed.  Placing on prednisone.      Relevant Orders   DG Chest 2 View (Completed)   COVID-19, Flu A+B and RSV    Meds ordered this encounter  Medications   predniSONE (DELTASONE) 10 MG tablet    Sig: 50 mg daily x 2 days, then 40 mg daily x 2 days, then 30 mg daily x 2 days, then 20 mg daily x 2 days, then 10 mg daily x 2 days.    Dispense:  30 tablet    Refill:  0   Alana Dayton DO [facility]

## 2021-04-12 NOTE — Assessment & Plan Note (Signed)
Chest x-ray was obtained today.  Independently reviewed by me.  Interpretation: Normal chest x-ray.  No evidence of infiltrate. Albuterol as directed.  Placing on prednisone.

## 2021-04-12 NOTE — Patient Instructions (Signed)
Albuterol every 6 hours while awake.  Prednisone as prescribed.  Chest x-ray at the hospital.  Take care  Dr. Lacinda Axon

## 2021-04-12 NOTE — Assessment & Plan Note (Signed)
Treating with albuterol and prednisone.

## 2021-04-13 LAB — COVID-19, FLU A+B AND RSV
Influenza A, NAA: NOT DETECTED
Influenza B, NAA: NOT DETECTED
RSV, NAA: NOT DETECTED
SARS-CoV-2, NAA: NOT DETECTED

## 2021-04-13 LAB — SPECIMEN STATUS REPORT

## 2021-04-30 ENCOUNTER — Telehealth: Payer: Self-pay | Admitting: Family Medicine

## 2021-04-30 NOTE — Telephone Encounter (Signed)
Patient brought in another FMLA to be completed for his job because he was tested for Covid again on 1/16/-1/18. In your yellow folder for completion.

## 2021-06-03 ENCOUNTER — Other Ambulatory Visit: Payer: Self-pay

## 2021-06-03 ENCOUNTER — Ambulatory Visit
Admission: EM | Admit: 2021-06-03 | Discharge: 2021-06-03 | Disposition: A | Discharge status: Home / Self Care | Payer: BC Managed Care – PPO | Attending: Urgent Care | Admitting: Urgent Care

## 2021-06-03 DIAGNOSIS — M62838 Other muscle spasm: Secondary | ICD-10-CM

## 2021-06-03 DIAGNOSIS — M542 Cervicalgia: Secondary | ICD-10-CM

## 2021-06-03 DIAGNOSIS — M436 Torticollis: Secondary | ICD-10-CM | POA: Diagnosis not present

## 2021-06-03 MED ORDER — NAPROXEN 500 MG PO TABS: 500.0000 mg | ORAL_TABLET | Freq: Two times a day (BID) | ORAL | 0 refills | Status: DC

## 2021-06-03 MED ORDER — TIZANIDINE HCL 4 MG PO TABS: 4.0000 mg | ORAL_TABLET | Freq: Every day | ORAL | 0 refills | Status: DC

## 2021-06-03 NOTE — ED Triage Notes (Signed)
Pt presents with c/o left side neck pain that began Monday morning, denies injury  ?

## 2021-06-03 NOTE — ED Provider Notes (Signed)
?[facility] ? ? ?MRN: [id number] DOB: 07/16/1995 ? ?Subjective:  ? ?Gabriel Gonzalez is a 26 y.o. male presenting for 4 day history of persistent left-sided neck pain, left trapezius pain.  He is also had slight stiffness.  Woke up with his symptoms and cannot think of any particular inciting factors.  No falls, trauma.  No weakness, numbness or tingling.  No radicular pain.  No history of musculoskeletal disorders.  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  He tries to hydrate with plain water.  Does not know exactly how much he drinks.  Limits other beverages including no alcohol. ? ?No current facility-administered medications for this encounter. ? ?Current Outpatient Medications:  ?  albuterol (VENTOLIN HFA) 108 (90 Base) MCG/ACT inhaler, Inhale 1-2 puffs into the lungs every 6 (six) hours as needed for wheezing or shortness of breath., Disp: 18 g, Rfl: 0 ?  predniSONE (DELTASONE) 10 MG tablet, 50 mg daily x 2 days, then 40 mg daily x 2 days, then 30 mg daily x 2 days, then 20 mg daily x 2 days, then 10 mg daily x 2 days., Disp: 30 tablet, Rfl: 0 ?  promethazine-dextromethorphan (PROMETHAZINE-DM) 6.25-15 MG/5ML syrup, Take 5 mLs by mouth at bedtime as needed for cough., Disp: 100 mL, Rfl: 0  ? ?No Known Allergies ? ?History reviewed. No pertinent past medical history.  ? ?History reviewed. No pertinent surgical history. ? ?Family History  ?Problem Relation Age of Onset  ? Cancer Maternal Grandmother   ?     liver  ? Psoriasis Mother   ? Eczema Maternal Aunt   ? Asthma Paternal Uncle   ? Urticaria Neg Hx   ? Immunodeficiency Neg Hx   ? Atopy Neg Hx   ? Angioedema Neg Hx   ? Allergic rhinitis Neg Hx   ? ? ?Social History  ? ?Tobacco Use  ? Smoking status: Never  ? Smokeless tobacco: Never  ?Vaping Use  ? Vaping Use: Never used  ?Substance Use Topics  ? Alcohol use: Never  ? Drug use: Never  ? ? ?ROS ? ? ?Objective:  ? ?Vitals: ?BP 126/81   Pulse 77    Temp 98.3 ?F (36.8 ?C)   Resp 18   SpO2 95%  ? ?Physical Exam ?Constitutional:   ?   General: He is not in acute distress. ?   Appearance: Normal appearance. He is well-developed and normal weight. He is not ill-appearing, toxic-appearing or diaphoretic.  ?HENT:  ?   Head: Normocephalic and atraumatic.  ?   Right Ear: External ear normal.  ?   Left Ear: External ear normal.  ?   Nose: Nose normal.  ?   Mouth/Throat:  ?   Pharynx: Oropharynx is clear.  ?Eyes:  ?   General: No scleral icterus.    ?   Right eye: No discharge.     ?   Left eye: No discharge.  ?   Extraocular Movements: Extraocular movements intact.  ?Cardiovascular:  ?   Rate and Rhythm: Normal rate.  ?Pulmonary:  ?   Effort: Pulmonary effort is normal.  ?Musculoskeletal:  ?   Cervical back: Spasms and tenderness (along area outlined with associated spasms, contractures) present. No swelling, edema, deformity, erythema, signs of trauma, lacerations, rigidity, torticollis, bony tenderness or crepitus. Pain with movement present. Decreased range of motion.  ?     Back: ? ?   Comments: Negative Spurling and Lhermitte  sign.  ?Neurological:  ?   Mental Status: He is alert and oriented to person, place, and time.  ?Psychiatric:     ?   Mood and Affect: Mood normal.     ?   Behavior: Behavior normal.     ?   Thought Content: Thought content normal.     ?   Judgment: Judgment normal.  ? ? ?Assessment and Plan :  ? ?PDMP not reviewed this encounter. ? ?1. Neck pain   ?2. Trapezius muscle spasm   ?3. Neck stiffness   ? ?We will defer steroid use for now.  Low suspicion for cervical radiculopathy and acute spine or neurologic emergency.  Recommended conservative management with naproxen, tizanidine and rest. Counseled patient on potential for adverse effects with medications prescribed/recommended today, ER and return-to-clinic precautions discussed, patient verbalized understanding. ? ?  ?Wallis Bamberg, PA-C ?06/03/21 1141 ? ?

## 2021-06-04 DIAGNOSIS — M542 Cervicalgia: Secondary | ICD-10-CM | POA: Diagnosis not present

## 2021-12-16 ENCOUNTER — Emergency Department (HOSPITAL_COMMUNITY): Payer: BC Managed Care – PPO

## 2021-12-16 ENCOUNTER — Emergency Department (HOSPITAL_COMMUNITY)
Admission: EM | Admit: 2021-12-16 | Discharge: 2021-12-16 | Disposition: A | Discharge status: Home / Self Care | Payer: BC Managed Care – PPO | Attending: Emergency Medicine | Admitting: Emergency Medicine

## 2021-12-16 ENCOUNTER — Other Ambulatory Visit: Payer: Self-pay

## 2021-12-16 ENCOUNTER — Encounter (HOSPITAL_COMMUNITY): Payer: Self-pay

## 2021-12-16 DIAGNOSIS — Z7951 Long term (current) use of inhaled steroids: Secondary | ICD-10-CM | POA: Diagnosis not present

## 2021-12-16 DIAGNOSIS — D72829 Elevated white blood cell count, unspecified: Secondary | ICD-10-CM | POA: Diagnosis not present

## 2021-12-16 DIAGNOSIS — J45909 Unspecified asthma, uncomplicated: Secondary | ICD-10-CM | POA: Diagnosis not present

## 2021-12-16 DIAGNOSIS — R109 Unspecified abdominal pain: Secondary | ICD-10-CM | POA: Diagnosis not present

## 2021-12-16 DIAGNOSIS — K859 Acute pancreatitis without necrosis or infection, unspecified: Secondary | ICD-10-CM | POA: Insufficient documentation

## 2021-12-16 DIAGNOSIS — R748 Abnormal levels of other serum enzymes: Secondary | ICD-10-CM | POA: Diagnosis not present

## 2021-12-16 DIAGNOSIS — R1012 Left upper quadrant pain: Secondary | ICD-10-CM | POA: Diagnosis not present

## 2021-12-16 LAB — URINALYSIS, ROUTINE W REFLEX MICROSCOPIC
Bilirubin Urine: NEGATIVE
Glucose, UA: NEGATIVE mg/dL
Hgb urine dipstick: NEGATIVE
Ketones, ur: NEGATIVE mg/dL
Leukocytes,Ua: NEGATIVE
Nitrite: NEGATIVE
Protein, ur: NEGATIVE mg/dL
Specific Gravity, Urine: 1.024 (ref 1.005–1.030)
pH: 5 (ref 5.0–8.0)

## 2021-12-16 LAB — COMPREHENSIVE METABOLIC PANEL
ALT: 64 U/L — ABNORMAL HIGH (ref 0–44)
AST: 42 U/L — ABNORMAL HIGH (ref 15–41)
Albumin: 4.7 g/dL (ref 3.5–5.0)
Alkaline Phosphatase: 43 U/L (ref 38–126)
Anion gap: 9 (ref 5–15)
BUN: 14 mg/dL (ref 6–20)
CO2: 27 mmol/L (ref 22–32)
Calcium: 9.1 mg/dL (ref 8.9–10.3)
Chloride: 102 mmol/L (ref 98–111)
Creatinine, Ser: 0.86 mg/dL (ref 0.61–1.24)
GFR, Estimated: >60 mL/min (ref >60)
Glucose, Bld: 98 mg/dL (ref 70–99)
Potassium: 4.2 mmol/L (ref 3.5–5.1)
Sodium: 138 mmol/L (ref 135–145)
Total Bilirubin: 0.8 mg/dL (ref 0.3–1.2)
Total Protein: 7.5 g/dL (ref 6.5–8.1)

## 2021-12-16 LAB — CBC
HCT: 44.9 % (ref 39.0–52.0)
Hemoglobin: 15.9 g/dL (ref 13.0–17.0)
MCH: 30.6 pg (ref 26.0–34.0)
MCHC: 35.4 g/dL (ref 30.0–36.0)
MCV: 86.5 fL (ref 80.0–100.0)
Platelets: 230 10*3/uL (ref 150–400)
RBC: 5.19 MIL/uL (ref 4.22–5.81)
RDW: 12.7 % (ref 11.5–15.5)
WBC: 14.1 10*3/uL — ABNORMAL HIGH (ref 4.0–10.5)
nRBC: 0 % (ref 0.0–0.2)

## 2021-12-16 LAB — LIPID PANEL
Cholesterol: 131 mg/dL (ref 0–200)
HDL: 20 mg/dL — ABNORMAL LOW (ref >40)
LDL Cholesterol: 73 mg/dL (ref 0–99)
Total CHOL/HDL Ratio: 6.6 RATIO
Triglycerides: 191 mg/dL — ABNORMAL HIGH (ref <150)
VLDL: 38 mg/dL (ref 0–40)

## 2021-12-16 LAB — LIPASE, BLOOD: Lipase: 205 U/L — ABNORMAL HIGH (ref 11–51)

## 2021-12-16 MED ORDER — ONDANSETRON 4 MG PO TBDP
4.0000 mg | ORAL_TABLET | Freq: Once | ORAL | Status: AC
  Administered 2021-12-16: 4 mg via ORAL
  Filled 2021-12-16: qty 1

## 2021-12-16 MED ORDER — OXYCODONE HCL 5 MG PO TABS: 5.0000 mg | ORAL_TABLET | ORAL | 0 refills | Status: DC | PRN

## 2021-12-16 MED ORDER — IOHEXOL 300 MG/ML  SOLN
100.0000 mL | Freq: Once | INTRAMUSCULAR | Status: AC | PRN
  Administered 2021-12-16: 100 mL via INTRAVENOUS

## 2021-12-16 MED ORDER — ONDANSETRON HCL 4 MG PO TABS: 4.0000 mg | ORAL_TABLET | Freq: Four times a day (QID) | ORAL | 0 refills | Status: DC

## 2021-12-16 MED ORDER — OXYCODONE HCL 5 MG PO TABS
5.0000 mg | ORAL_TABLET | Freq: Once | ORAL | Status: AC
  Administered 2021-12-16: 5 mg via ORAL
  Filled 2021-12-16: qty 1

## 2021-12-16 NOTE — ED Triage Notes (Signed)
Pt to er, pt states that around 2pm he started having some abd pain. States that it started on his L side and moved over and is now around his umbilicus, states that he drove to his parents house. Denies vomiting but c/o nausea.

## 2021-12-16 NOTE — Discharge Instructions (Addendum)
Take the medications prescribed if needed for pain and nausea.  Use caution with the oxycodone as this medication will make you drowsy, do not drive within 4 hours of taking this medicine.  As discussed you will need to maintain a clear liquid diet for the least the next 24 hours, given your pancreas a chance to "rest".  If you are feeling significantly improved you can slowly add bland foods however if this triggers return of your pain you will need to return to the clear liquid diet.  Plan to see your primary doctor next week as discussed for repeat labs to ensure your lipase and your liver enzymes are improving.  You will also need to see a gastroenterologist as discussed, call Dr. Roseanne Kaufman office for an appointment with him as well.  If you develop severe pain, vomiting or development of fever I recommend returning here for consideration of hospital admission.

## 2021-12-16 NOTE — ED Provider Notes (Signed)
[facility] EMERGENCY DEPARTMENT Provider Note   CSN: [id number] Arrival date & time: 12/16/21  1532     History  Chief Complaint  Patient presents with   Abdominal Pain    Gabriel Gonzalez is a 26 y.o. male with no significant past medical history other than well-controlled asthma presenting for evaluation of abdominal pain which started around 2 PM today.  He describes pain that started on his left upper abdomen became very intense very quickly and now radiates into his mid abdominal region.  His pain is improved currently, he rates it a 10 out of 10 at its peak, it is now 3 out of 10.  He has had no treatments prior to arrival.  He states his pain worsens with movement.  He has complaints of nausea but no emesis, also denies fevers or chills, bowel changes including constipation or diarrhea, denies dysuria, flank or back pain.  He denies prior history of similar symptoms.  Has had no treatment prior to arrival.  The history is provided by the patient.       Home Medications Prior to Admission medications   Medication Sig Start Date End Date Taking? Authorizing Provider  ondansetron (ZOFRAN) 4 MG tablet Take 1 tablet (4 mg total) by mouth every 6 (six) hours. 12/16/21  Yes Pearson Reasons, Raynelle Fanning, PA-C  albuterol (VENTOLIN HFA) 108 (90 Base) MCG/ACT inhaler Inhale 1-2 puffs into the lungs every 6 (six) hours as needed for wheezing or shortness of breath. 03/11/21   Wallis Bamberg, PA-C  naproxen (NAPROSYN) 500 MG tablet Take 1 tablet (500 mg total) by mouth 2 (two) times daily with a meal. 06/03/21   Wallis Bamberg, PA-C  oxyCODONE (ROXICODONE) 5 MG immediate release tablet Take 1 tablet (5 mg total) by mouth every 4 (four) hours as needed for severe pain. 12/16/21   Bryden Darden, Raynelle Fanning, PA-C  predniSONE (DELTASONE) 10 MG tablet 50 mg daily x 2 days, then 40 mg daily x 2 days, then 30 mg daily x 2 days, then 20 mg daily x 2 days, then 10 mg daily x 2 days. 04/12/21   Tommie Sams, DO   promethazine-dextromethorphan (PROMETHAZINE-DM) 6.25-15 MG/5ML syrup Take 5 mLs by mouth at bedtime as needed for cough. 03/11/21   Wallis Bamberg, PA-C  tiZANidine (ZANAFLEX) 4 MG tablet Take 1 tablet (4 mg total) by mouth at bedtime. 06/03/21   Wallis Bamberg, PA-C      Allergies    Patient has no known allergies.    Review of Systems   Review of Systems  Constitutional:  Negative for chills and fever.  HENT:  Negative for congestion and sore throat.   Eyes: Negative.   Respiratory:  Negative for chest tightness and shortness of breath.   Cardiovascular:  Negative for chest pain.  Gastrointestinal:  Positive for abdominal pain and nausea. Negative for vomiting.  Genitourinary: Negative.  Negative for dysuria and flank pain.  Musculoskeletal:  Negative for arthralgias, joint swelling and neck pain.  Skin: Negative.  Negative for rash and wound.  Neurological:  Negative for dizziness, weakness, light-headedness, numbness and headaches.  Psychiatric/Behavioral: Negative.    All other systems reviewed and are negative.   Physical Exam Updated Vital Signs BP 127/83 (BP Location: Left Arm)   Pulse 79   Temp 98.4 F (36.9 C) (Oral)   Resp 17   Ht 6\' 1"  (1.854 m)   Wt 124.7 kg   SpO2 99%   BMI 36.28 kg/m  Physical Exam Vitals and  nursing note reviewed.  Constitutional:      Appearance: He is well-developed.  HENT:     Head: Normocephalic and atraumatic.  Eyes:     Conjunctiva/sclera: Conjunctivae normal.  Cardiovascular:     Rate and Rhythm: Normal rate and regular rhythm.     Heart sounds: Normal heart sounds.  Pulmonary:     Effort: Pulmonary effort is normal.     Breath sounds: Normal breath sounds. No wheezing.  Abdominal:     General: Bowel sounds are normal.     Palpations: Abdomen is soft.     Tenderness: There is abdominal tenderness in the epigastric area and left upper quadrant. There is no guarding. Negative signs include Murphy's sign.  Musculoskeletal:         General: Normal range of motion.     Cervical back: Normal range of motion.  Skin:    General: Skin is warm and dry.  Neurological:     Mental Status: He is alert.     ED Results / Procedures / Treatments   Labs (all labs ordered are listed, but only abnormal results are displayed) Labs Reviewed  LIPASE, BLOOD - Abnormal; Notable for the following components:      Result Value   Lipase 205 (*)    All other components within normal limits  COMPREHENSIVE METABOLIC PANEL - Abnormal; Notable for the following components:   AST 42 (*)    ALT 64 (*)    All other components within normal limits  CBC - Abnormal; Notable for the following components:   WBC 14.1 (*)    All other components within normal limits  LIPID PANEL - Abnormal; Notable for the following components:   Triglycerides 191 (*)    HDL 20 (*)    All other components within normal limits  URINALYSIS, ROUTINE W REFLEX MICROSCOPIC    EKG None  Radiology CT ABDOMEN PELVIS W CONTRAST  Result Date: 12/16/2021 CLINICAL DATA:  Left upper quadrant abdominal pain. EXAM: CT ABDOMEN AND PELVIS WITH CONTRAST TECHNIQUE: Multidetector CT imaging of the abdomen and pelvis was performed using the standard protocol following bolus administration of intravenous contrast. RADIATION DOSE REDUCTION: This exam was performed according to the departmental dose-optimization program which includes automated exposure control, adjustment of the mA and/or kV according to patient size and/or use of iterative reconstruction technique. CONTRAST:  115mL OMNIPAQUE IOHEXOL 300 MG/ML  SOLN COMPARISON:  None Available. FINDINGS: Lower chest: The visualized lung bases are clear. No intra-abdominal free air or free fluid. Hepatobiliary: Fatty liver. No BD dilatation. The gallbladder is unremarkable. Pancreas: There is mild inflammatory changes surrounding the distal body and tail of the pancreas concerning for acute pancreatitis. Correlation with pancreatic  enzymes recommended. No drainable fluid collection/abscess or pseudocyst. Spleen: Normal in size without focal abnormality. Adrenals/Urinary Tract: The adrenal glands are unremarkable. The kidneys, visualized ureters, and urinary bladder appear unremarkable. Stomach/Bowel: There is no bowel obstruction or active inflammation. The appendix is normal. Vascular/Lymphatic: The abdominal aorta and IVC are unremarkable. No portal venous gas. There is no adenopathy. Reproductive: The prostate and seminal vesicles are grossly unremarkable. No pelvic mass Other: None Musculoskeletal: No acute or significant osseous findings. IMPRESSION: 1. Findings most consistent with acute pancreatitis. Correlation with pancreatic enzymes recommended. No drainable fluid collection/abscess or pseudocyst. 2. Fatty liver. 3. No bowel obstruction. Normal appendix. Electronically Signed   By: Anner Crete M.D.   On: 12/16/2021 19:42    Procedures Procedures    Medications Ordered in ED  Medications  iohexol (OMNIPAQUE) 300 MG/ML solution 100 mL (100 mLs Intravenous Contrast Given 12/16/21 1932)  oxyCODONE (Oxy IR/ROXICODONE) immediate release tablet 5 mg (5 mg Oral Given 12/16/21 2059)  ondansetron (ZOFRAN-ODT) disintegrating tablet 4 mg (4 mg Oral Given 12/16/21 2100)    ED Course/ Medical Decision Making/ A&P                           Medical Decision Making Patient with left upper quadrant pain, improving without treatment.  Nausea without emesis.  He has a significant elevation in his lipase at 205, LFTs are also slightly bumped with a AST of 42 and ALT of 64.  WBC count of 14.1, all other labs normal.  These findings were discussed with the patient.  He denies alcohol use, no excessive Tylenol use.  No history of prior episodes of pancreatitis or known gallbladder disease.  Ultrasound is not available this evening, we proceeded to CT imaging.  CT confirms acute pancreatitis without any evidence of acute cholecystitis,  gallbladder is without stones or gallbladder wall thickening.  No bile duct enlargement.  He is fairly comfortable after work-up is completed, no indication for admission at this time however strict return precautions were discussed.  He was given a referral to Dr. Jena Gauss of GI for further management of this new onset pancreatitis.  A lipid panel was collected prior to discharge home.  Additionally, recommended recheck early next week with his primary MD to make sure his liver and pancreas enzymes are improving.  Strict return precautions were outlined.  Clear liquid diet was outlined as well.  Amount and/or Complexity of Data Reviewed Labs: ordered.    Details: Per above Radiology: ordered.  Risk Prescription drug management. Decision regarding hospitalization. Risk Details: No indication for hospitalization at this time, however if he fails outpatient treatment he was advised he needs to come back for admission.           Final Clinical Impression(s) / ED Diagnoses Final diagnoses:  Acute pancreatitis without infection or necrosis, unspecified pancreatitis type    Rx / DC Orders ED Discharge Orders          Ordered    ondansetron (ZOFRAN) 4 MG tablet  Every 6 hours        12/16/21 2054    oxyCODONE (ROXICODONE) 5 MG immediate release tablet  Every 4 hours PRN,   Status:  Discontinued        12/16/21 2054    oxyCODONE (ROXICODONE) 5 MG immediate release tablet  Every 4 hours PRN        12/16/21 2059              Burgess Amor, Cordelia Poche 12/16/21 2140    Jacalyn Lefevre, MD 12/16/21 2349

## 2021-12-22 ENCOUNTER — Ambulatory Visit (INDEPENDENT_AMBULATORY_CARE_PROVIDER_SITE_OTHER): Payer: BC Managed Care – PPO | Admitting: Family Medicine

## 2021-12-22 VITALS — BP 119/82 | Ht 73.0 in | Wt 282.2 lb

## 2021-12-22 DIAGNOSIS — K8500 Idiopathic acute pancreatitis without necrosis or infection: Secondary | ICD-10-CM

## 2021-12-22 NOTE — Progress Notes (Signed)
Subjective:  Patient ID: Gabriel Gonzalez, male    DOB: Jul 02, 1995  Age: 26 y.o. MRN: [id number]  CC: Chief Complaint  Patient presents with   ER follow up    Pancreatitis. Patient states he is feeling better    HPI:  26 year old male presents for hospital follow up.  Recently seen 9/21. Found to have acute pancreatitis. Confirmed with CT findings. Culprit for pancreatitis unclear. Was not admitted and was discharged home.  Currently feeling well. No nausea, vomiting, abdominal pain. Eating well. Denies recent medication use. No alcohol. Imaging with no evidence of gallstones. No significant family history of pancreatitis (mother had gallstones however). No fever. No complaints at this time.  Patient Active Problem List   Diagnosis Date Noted   Idiopathic acute pancreatitis without infection or necrosis 12/22/2021   Mild persistent asthma 01/21/2015   Allergic rhinitis 07/30/2012    Social Hx   Social History   Socioeconomic History   Marital status: Single    Spouse name: Not on file   Number of children: Not on file   Years of education: Not on file   Highest education level: Not on file  Occupational History   Not on file  Tobacco Use   Smoking status: Never   Smokeless tobacco: Never  Vaping Use   Vaping Use: Never used  Substance and Sexual Activity   Alcohol use: Never   Drug use: Never   Sexual activity: Yes  Other Topics Concern   Not on file  Social History Narrative   Not on file   Social Determinants of Health   Financial Resource Strain: Not on file  Food Insecurity: Not on file  Transportation Needs: Not on file  Physical Activity: Not on file  Stress: Not on file  Social Connections: Not on file    Review of Systems Per HPI  Objective:  BP 119/82   Ht _0  (1.854 m)   Wt 282 lb 3.2 oz (128 kg)   BMI 37.23 kg/m      12/22/2021    3:16 PM 12/16/2021    8:10 PM 12/16/2021    3:57 PM  BP/Weight  Systolic BP 469 629 528   Diastolic BP 82 83 94  Wt. (Lbs) 282.2  275  BMI 37.23 kg/m2  36.28 kg/m2    Physical Exam Constitutional:      General: He is not in acute distress.    Appearance: Normal appearance. He is obese.  HENT:     Head: Normocephalic and atraumatic.  Cardiovascular:     Rate and Rhythm: Normal rate and regular rhythm.  Pulmonary:     Effort: Pulmonary effort is normal.     Breath sounds: Normal breath sounds.  Abdominal:     General: There is no distension.     Palpations: Abdomen is soft.     Tenderness: There is no abdominal tenderness. There is no guarding or rebound.  Neurological:     Mental Status: He is alert.  Psychiatric:        Mood and Affect: Mood normal.        Behavior: Behavior normal.     Lab Results  Component Value Date   WBC 14.1 (H) 12/16/2021   HGB 15.9 12/16/2021   HCT 44.9 12/16/2021   PLT 230 12/16/2021   GLUCOSE 98 12/16/2021   CHOL 131 12/16/2021   TRIG 191 (H) 12/16/2021   HDL 20 (L) 12/16/2021   LDLCALC 73 12/16/2021  ALT 64 (H) 12/16/2021   AST 42 (H) 12/16/2021   NA 138 12/16/2021   K 4.2 12/16/2021   CL 102 12/16/2021   CREATININE 0.86 12/16/2021   BUN 14 12/16/2021   CO2 27 12/16/2021     Assessment & Plan:   Problem List Items Addressed This Visit       Digestive   Idiopathic acute pancreatitis without infection or necrosis - Primary    No apparent cause at this time - No evidence of gallstones, alcohol use, supplement use/drug use, significantly elevated triglycerides.  Doing well at this time. No symptoms. Eating well. Will discuss with GI. Will consider genetic testing. Labs today.      Relevant Orders   CBC   CMP14+EGFR   Lipase   Follow-up:  Pending labs/discussion with GI.  Thersa Salt DO [place]

## 2021-12-22 NOTE — Assessment & Plan Note (Addendum)
No apparent cause at this time - No evidence of gallstones, alcohol use, supplement use/drug use, significantly elevated triglycerides.  Doing well at this time. No symptoms. Eating well. Will discuss with GI. Will consider genetic testing. Labs today.

## 2021-12-23 LAB — CMP14+EGFR
ALT: 52 IU/L — ABNORMAL HIGH (ref 0–44)
AST: 37 IU/L (ref 0–40)
Albumin/Globulin Ratio: 2.1 (ref 1.2–2.2)
Albumin: 5.1 g/dL (ref 4.3–5.2)
Alkaline Phosphatase: 58 IU/L (ref 44–121)
BUN/Creatinine Ratio: 14 (ref 9–20)
BUN: 12 mg/dL (ref 6–20)
Bilirubin Total: 0.3 mg/dL (ref 0.0–1.2)
CO2: 25 mmol/L (ref 20–29)
Calcium: 9.8 mg/dL (ref 8.7–10.2)
Chloride: 102 mmol/L (ref 96–106)
Creatinine, Ser: 0.83 mg/dL (ref 0.76–1.27)
Globulin, Total: 2.4 g/dL (ref 1.5–4.5)
Glucose: 88 mg/dL (ref 70–99)
Potassium: 4.6 mmol/L (ref 3.5–5.2)
Sodium: 142 mmol/L (ref 134–144)
Total Protein: 7.5 g/dL (ref 6.0–8.5)
eGFR: 123 mL/min/{1.73_m2} (ref >59)

## 2021-12-23 LAB — CBC
Hematocrit: 45.7 % (ref 37.5–51.0)
Hemoglobin: 16 g/dL (ref 13.0–17.7)
MCH: 30.4 pg (ref 26.6–33.0)
MCHC: 35 g/dL (ref 31.5–35.7)
MCV: 87 fL (ref 79–97)
Platelets: 280 10*3/uL (ref 150–450)
RBC: 5.27 x10E6/uL (ref 4.14–5.80)
RDW: 13.1 % (ref 11.6–15.4)
WBC: 9.9 10*3/uL (ref 3.4–10.8)

## 2021-12-23 LAB — LIPASE: Lipase: 39 U/L (ref 13–78)

## 2021-12-29 ENCOUNTER — Ambulatory Visit (INDEPENDENT_AMBULATORY_CARE_PROVIDER_SITE_OTHER): Payer: BC Managed Care – PPO | Admitting: Internal Medicine

## 2021-12-29 ENCOUNTER — Telehealth: Payer: Self-pay | Admitting: *Deleted

## 2021-12-29 ENCOUNTER — Other Ambulatory Visit: Payer: Self-pay | Admitting: *Deleted

## 2021-12-29 ENCOUNTER — Encounter: Payer: Self-pay | Admitting: Internal Medicine

## 2021-12-29 VITALS — BP 118/82 | HR 96 | Temp 98.5°F | Ht 73.0 in | Wt 279.0 lb

## 2021-12-29 DIAGNOSIS — R7989 Other specified abnormal findings of blood chemistry: Secondary | ICD-10-CM

## 2021-12-29 DIAGNOSIS — K8500 Idiopathic acute pancreatitis without necrosis or infection: Secondary | ICD-10-CM

## 2021-12-29 NOTE — Patient Instructions (Signed)
I am going to order an MRI to further evaluate your pancreas as well as gallbladder and the ducts that supply the liver and pancreas.  We will call you with these results.  I am happy to hear that you are feeling better.  It was very nice meeting you today.  Dr. Abbey Chatters

## 2021-12-29 NOTE — Telephone Encounter (Signed)
Per [employer]  for [employer]"The following solutions for the service date entered do not require Pre-Authorization by [employer]. Please note that benefit limits, if applicable, will still be applied. Contact the health plan using the number on the back of the member's ID card if you have any questions regarding coverage or Pre-Authorization requirements."    MRI/MRCP scheduled for 10/27, arrival 7:30am, npo 4 hrs, LMOVM with appt details

## 2021-12-29 NOTE — Progress Notes (Signed)
Primary Care Physician:  Coral Spikes, DO Primary Gastroenterologist:  Dr. Abbey Chatters  Chief Complaint  Patient presents with   Follow-up    New patient. Arrives with mother Ebony Hail for ED follow up on pancreatitis. Feeling better and not having any symptoms.     HPI:   Gabriel Gonzalez is a 26 y.o. male who presents to the clinic today by referral from his PCP Dr. Lacinda Axon for evaluation.  Patient was in his normal state of health until 12/16/2021 when he had sudden onset of epigastric pain.  Does note some associated left-sided flank discomfort.  Nausea with 1 episode of vomiting.  Presented to the ER found to have elevated lipase of 205.  AST 42, ALT 64, T. bili normal, alk phos normal.  Underwent CT abdomen pelvis with contrast which I personally reviewed with patient.  Showed findings of mild pancreatitis.  No cholelithiasis seen.  Treated conservatively and patient recovered well.  Today states he is doing well.  No further abdominal pain.  Notes very minimal alcohol use.  No tobacco use.  Triglycerides slightly elevated at 191.  Normal calcium.  No personal or family history of pancreatitis in the past.  No previous episodes of pancreatitis.  Does not take any medications including over-the-counter medications besides albuterol.  No herbal supplements.  No personal or family history of autoimmune disease.  No abdominal trauma or surgical trauma to the area.  Past Medical History:  Diagnosis Date   Pancreatitis     History reviewed. No pertinent surgical history.  Current Outpatient Medications  Medication Sig Dispense Refill   albuterol (VENTOLIN HFA) 108 (90 Base) MCG/ACT inhaler Inhale 1-2 puffs into the lungs every 6 (six) hours as needed for wheezing or shortness of breath. 18 g 0   No current facility-administered medications for this visit.    Allergies as of 12/29/2021   (No Known Allergies)    Family History  Problem Relation Age of Onset   Psoriasis Mother     Diabetes Mother    Cancer Maternal Grandmother        liver   Asthma Paternal Uncle    Urticaria Neg Hx    Immunodeficiency Neg Hx    Atopy Neg Hx    Angioedema Neg Hx    Allergic rhinitis Neg Hx     Social History   Socioeconomic History   Marital status: Single    Spouse name: Not on file   Number of children: Not on file   Years of education: Not on file   Highest education level: Not on file  Occupational History   Not on file  Tobacco Use   Smoking status: Never    Passive exposure: Never   Smokeless tobacco: Never  Vaping Use   Vaping Use: Never used  Substance and Sexual Activity   Alcohol use: Never   Drug use: Never   Sexual activity: Yes  Other Topics Concern   Not on file  Social History Narrative   Not on file   Social Determinants of Health   Financial Resource Strain: Not on file  Food Insecurity: Not on file  Transportation Needs: Not on file  Physical Activity: Not on file  Stress: Not on file  Social Connections: Not on file  Intimate Partner Violence: Not on file    Subjective: Review of Systems  Constitutional:  Negative for chills and fever.  HENT:  Negative for congestion and hearing loss.   Eyes:  Negative for  blurred vision and double vision.  Respiratory:  Negative for cough and shortness of breath.   Cardiovascular:  Negative for chest pain and palpitations.  Gastrointestinal:  Negative for abdominal pain, blood in stool, constipation, diarrhea, heartburn, melena and vomiting.  Genitourinary:  Negative for dysuria and urgency.  Musculoskeletal:  Negative for joint pain and myalgias.  Skin:  Negative for itching and rash.  Neurological:  Negative for dizziness and headaches.  Psychiatric/Behavioral:  Negative for depression. The patient is not nervous/anxious.        Objective: BP 118/82 (BP Location: Right Arm, Patient Position: Sitting, Cuff Size: Large)   Pulse 96   Temp 98.5 F (36.9 C) (Oral)   Ht 6' 1"  (1.854 m)   Wt  279 lb (126.6 kg)   BMI 36.81 kg/m  Physical Exam Constitutional:      Appearance: Normal appearance.  HENT:     Head: Normocephalic and atraumatic.  Eyes:     Extraocular Movements: Extraocular movements intact.     Conjunctiva/sclera: Conjunctivae normal.  Cardiovascular:     Rate and Rhythm: Normal rate and regular rhythm.  Pulmonary:     Effort: Pulmonary effort is normal.     Breath sounds: Normal breath sounds.  Abdominal:     General: Bowel sounds are normal.     Palpations: Abdomen is soft.  Musculoskeletal:        General: Normal range of motion.     Cervical back: Normal range of motion and neck supple.  Skin:    General: Skin is warm.  Neurological:     General: No focal deficit present.     Mental Status: He is alert and oriented to person, place, and time.  Psychiatric:        Mood and Affect: Mood normal.        Behavior: Behavior normal.      Assessment: *Idiopathic pancreatitis *Abnormal LFTs *Nonalcoholic fatty liver disease  Plan: Etiology of patient's recent episode of pancreatitis unclear.  CT without evidence of gallstones. No chronic alcohol use. Normal calcium, slightly elevated triglycerides. No tobacco use. No medications including OTC medications, herbal supplements. No family history of pancreatitis.  No previous pancreatitis as a child.  No personal/family history of autoimmune disease.  CT without classic findings of autoimmune pancreatitis.  No abdominal trauma or surgical trauma in the past.  No exposure to scorpions.  We will order MRI/MRCP to further evaluate.  Possible microlithiasis?  We will call patient with results.  Thank you Dr. Lacinda Axon for the kind referral.  12/29/2021 3:38 PM   Disclaimer: This note was dictated with voice recognition software. Similar sounding words can inadvertently be transcribed and may not be corrected upon review.

## 2022-01-17 ENCOUNTER — Other Ambulatory Visit: Payer: Self-pay | Admitting: Internal Medicine

## 2022-01-17 DIAGNOSIS — K8500 Idiopathic acute pancreatitis without necrosis or infection: Secondary | ICD-10-CM

## 2022-01-17 DIAGNOSIS — R7989 Other specified abnormal findings of blood chemistry: Secondary | ICD-10-CM

## 2022-01-21 ENCOUNTER — Ambulatory Visit (HOSPITAL_COMMUNITY)
Admission: RE | Admit: 2022-01-21 | Discharge: 2022-01-21 | Disposition: A | Discharge status: Home / Self Care | Payer: BC Managed Care – PPO | Source: Ambulatory Visit | Attending: Internal Medicine | Admitting: Internal Medicine

## 2022-01-21 DIAGNOSIS — K859 Acute pancreatitis without necrosis or infection, unspecified: Secondary | ICD-10-CM | POA: Diagnosis not present

## 2022-01-21 DIAGNOSIS — K8500 Idiopathic acute pancreatitis without necrosis or infection: Secondary | ICD-10-CM

## 2022-01-21 DIAGNOSIS — R7989 Other specified abnormal findings of blood chemistry: Secondary | ICD-10-CM | POA: Diagnosis not present

## 2022-01-21 DIAGNOSIS — R935 Abnormal findings on diagnostic imaging of other abdominal regions, including retroperitoneum: Secondary | ICD-10-CM | POA: Diagnosis not present

## 2022-01-21 DIAGNOSIS — K760 Fatty (change of) liver, not elsewhere classified: Secondary | ICD-10-CM | POA: Diagnosis not present

## 2022-01-21 MED ORDER — GADOBUTROL 1 MMOL/ML IV SOLN
10.0000 mL | Freq: Once | INTRAVENOUS | Status: AC | PRN
  Administered 2022-01-21: 10 mL via INTRAVENOUS

## 2022-04-07 ENCOUNTER — Encounter: Payer: Self-pay | Admitting: Internal Medicine

## 2022-04-20 DIAGNOSIS — M9903 Segmental and somatic dysfunction of lumbar region: Secondary | ICD-10-CM | POA: Diagnosis not present

## 2022-04-20 DIAGNOSIS — M546 Pain in thoracic spine: Secondary | ICD-10-CM | POA: Diagnosis not present

## 2022-04-20 DIAGNOSIS — M62830 Muscle spasm of back: Secondary | ICD-10-CM | POA: Diagnosis not present

## 2022-04-20 DIAGNOSIS — M9902 Segmental and somatic dysfunction of thoracic region: Secondary | ICD-10-CM | POA: Diagnosis not present

## 2022-06-12 IMAGING — DX DG CHEST 2V
2 series · 2 of 2 positions shown · non-contrast
Comparison: None.

CLINICAL DATA: Chronic cough, wheezing

EXAM:
CHEST - 2 VIEW

[chest pa]
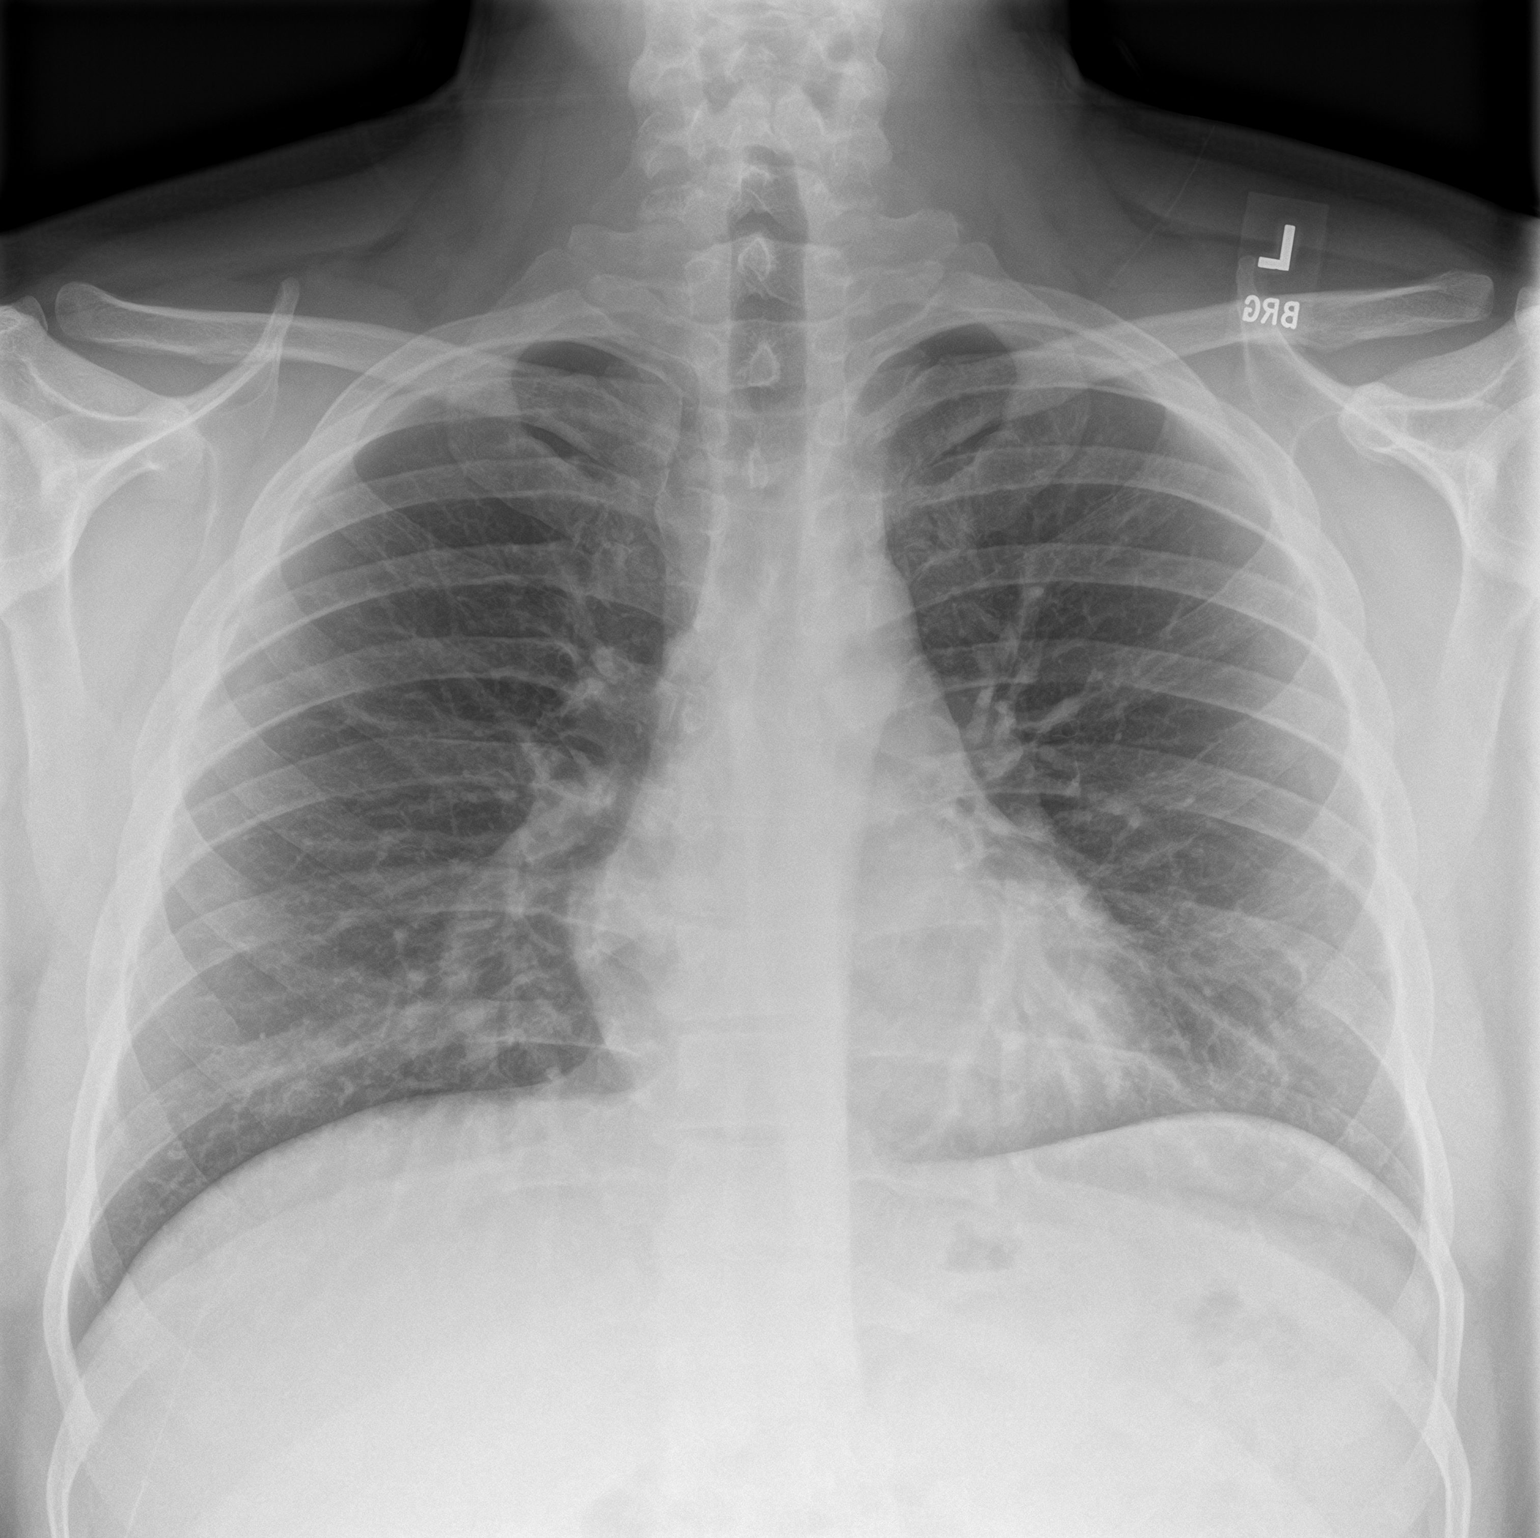

[chest lat]
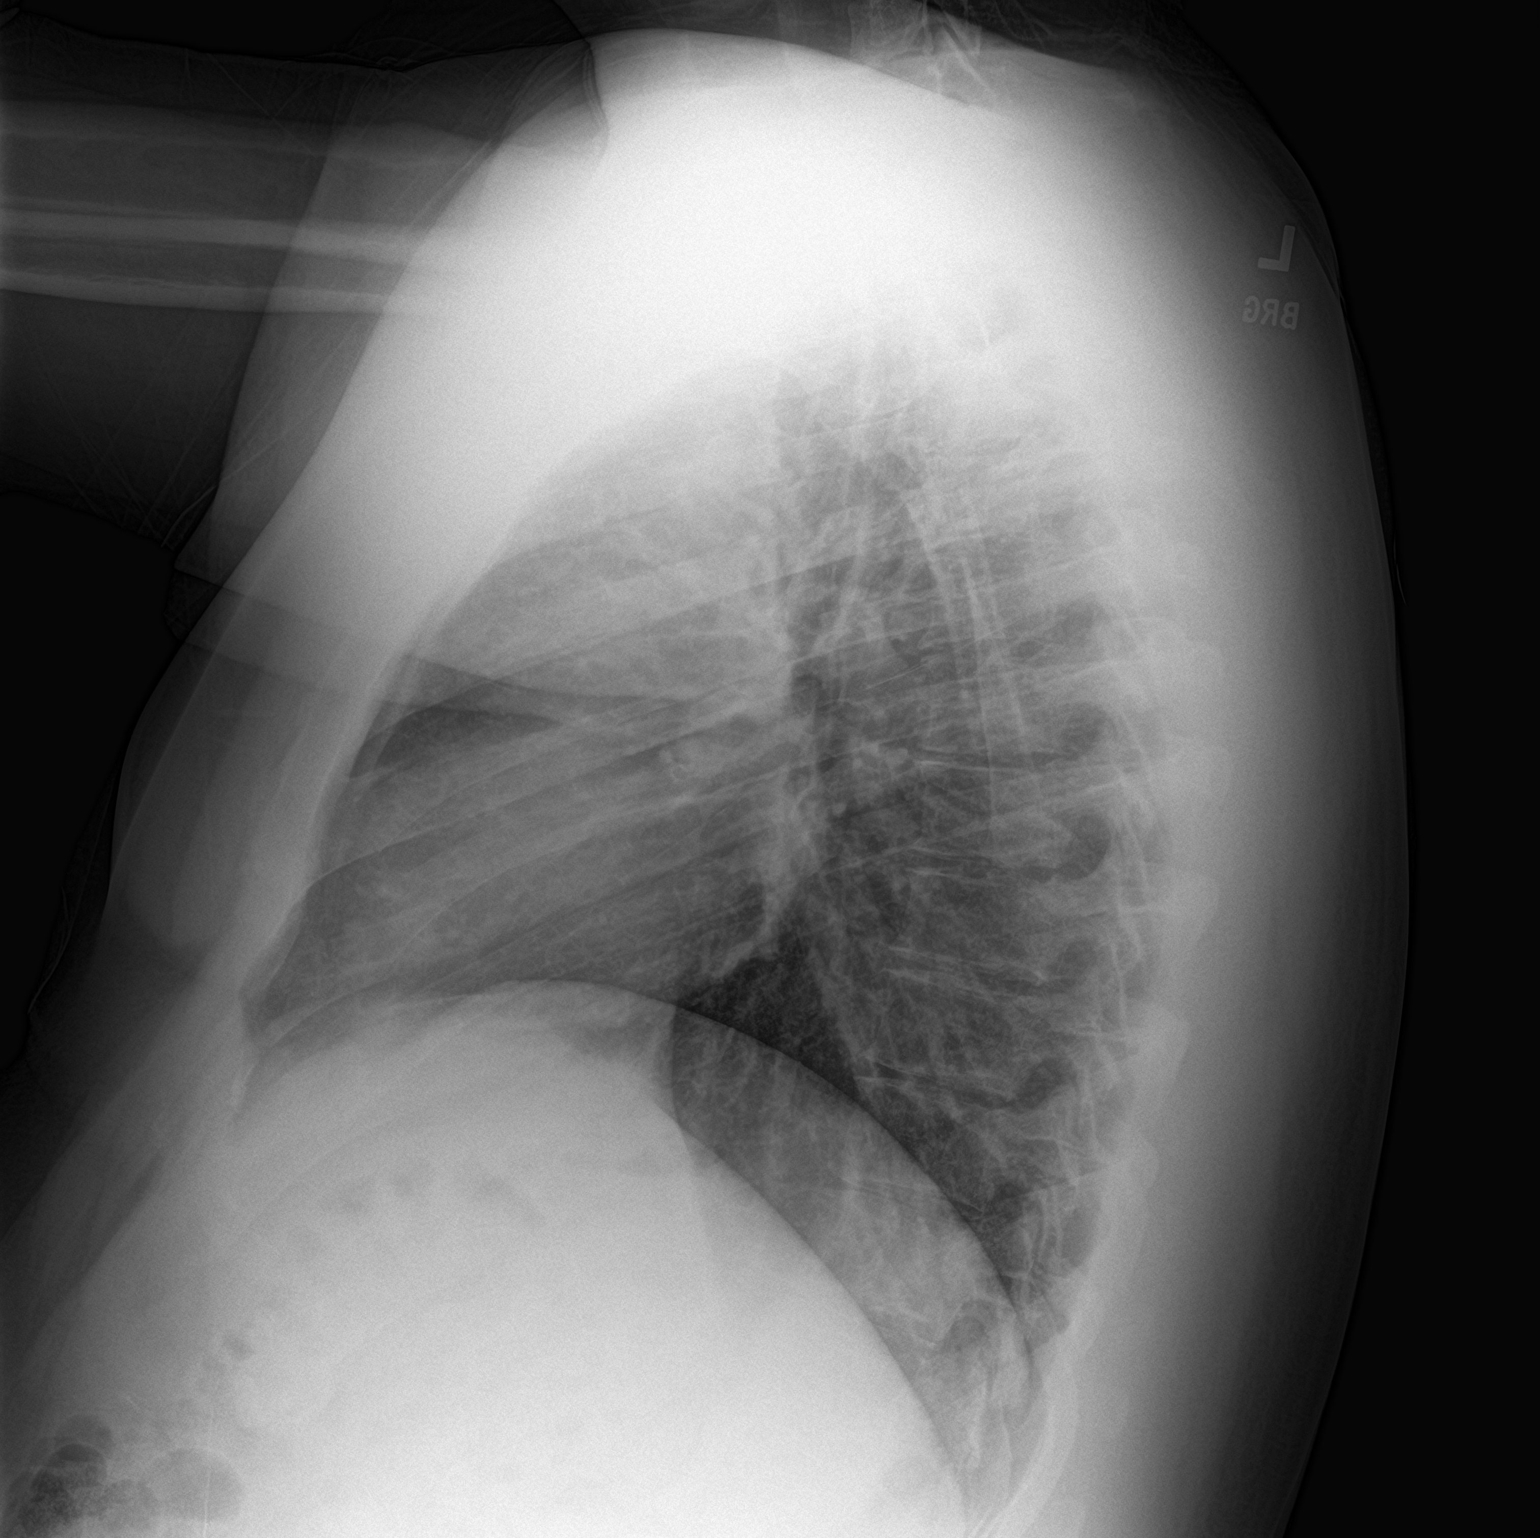

[2 of 2 positions shown; findings below may reference images not displayed]

FINDINGS: Cardiac and mediastinal contours are within normal limits. No focal
pulmonary opacity. No pleural effusion or pneumothorax. No acute
osseous abnormality.
IMPRESSION: No acute cardiopulmonary process.

## 2022-09-20 ENCOUNTER — Ambulatory Visit: Payer: BC Managed Care – PPO | Admitting: Family Medicine

## 2022-09-20 ENCOUNTER — Other Ambulatory Visit: Payer: Self-pay

## 2022-09-20 DIAGNOSIS — Z114 Encounter for screening for human immunodeficiency virus [HIV]: Secondary | ICD-10-CM | POA: Diagnosis not present

## 2022-09-20 DIAGNOSIS — Z113 Encounter for screening for infections with a predominantly sexual mode of transmission: Secondary | ICD-10-CM | POA: Diagnosis not present

## 2022-09-20 DIAGNOSIS — Z1159 Encounter for screening for other viral diseases: Secondary | ICD-10-CM | POA: Diagnosis not present

## 2022-09-21 LAB — HIV ANTIBODY (ROUTINE TESTING W REFLEX): HIV Screen 4th Generation wRfx: NONREACTIVE

## 2022-09-21 LAB — RPR: RPR Ser Ql: NONREACTIVE

## 2022-09-21 LAB — HEPATITIS C ANTIBODY: Hep C Virus Ab: NONREACTIVE

## 2022-09-22 LAB — CHLAMYDIA/GONOCOCCUS/TRICHOMONAS, NAA
Chlamydia by NAA: NEGATIVE
Gonococcus by NAA: NEGATIVE
Trich vag by NAA: NEGATIVE

## 2024-04-30 ENCOUNTER — Ambulatory Visit: Admitting: Family Medicine

## 2024-04-30 VITALS — BP 133/77 | HR 81 | Temp 98.6°F | Ht 73.0 in | Wt 274.0 lb

## 2024-04-30 DIAGNOSIS — J4530 Mild persistent asthma, uncomplicated: Secondary | ICD-10-CM | POA: Diagnosis not present

## 2024-04-30 MED ORDER — BUDESONIDE-FORMOTEROL FUMARATE 160-4.5 MCG/ACT IN AERO: 2.0000 | INHALATION_SPRAY | Freq: Two times a day (BID) | RESPIRATORY_TRACT | 3 refills | Status: AC

## 2024-04-30 MED ORDER — ALBUTEROL SULFATE HFA 108 (90 BASE) MCG/ACT IN AERS: 1.0000 | INHALATION_SPRAY | Freq: Four times a day (QID) | RESPIRATORY_TRACT | 0 refills | Status: AC | PRN

## 2024-04-30 NOTE — Assessment & Plan Note (Signed)
 Uncontrolled/worsening.  Starting Symbicort .

## 2024-04-30 NOTE — Patient Instructions (Signed)
 Medication as prescribed.  If continues to be problematic, I am happy to refer.  Follow up in 3-6 months.

## 2024-10-28 ENCOUNTER — Ambulatory Visit: Admitting: Family Medicine
